# Patient Record
Sex: Female | Born: 1940 | Race: Black or African American | Hispanic: No | Smoking: Former smoker
Health system: Southern US, Community
[De-identification: ages and names within clinical notes are randomized; demographics above are authoritative.]

## PROBLEM LIST (undated history)

## (undated) ENCOUNTER — Emergency Department (HOSPITAL_COMMUNITY): Payer: Medicaid Other

## (undated) DIAGNOSIS — I10 Essential (primary) hypertension: Secondary | ICD-10-CM

## (undated) DIAGNOSIS — H269 Unspecified cataract: Secondary | ICD-10-CM

## (undated) HISTORY — PX: EYE SURGERY: SHX253

## (undated) HISTORY — DX: Unspecified cataract: H26.9

---

## 2020-03-03 ENCOUNTER — Other Ambulatory Visit: Payer: Self-pay

## 2020-03-03 ENCOUNTER — Ambulatory Visit (HOSPITAL_COMMUNITY)
Admission: EM | Admit: 2020-03-03 | Discharge: 2020-03-03 | Disposition: A | Discharge status: Home / Self Care | Payer: Medicaid Other | Attending: Family Medicine | Admitting: Family Medicine

## 2020-03-03 ENCOUNTER — Encounter (HOSPITAL_COMMUNITY): Payer: Self-pay | Admitting: Emergency Medicine

## 2020-03-03 ENCOUNTER — Ambulatory Visit (INDEPENDENT_AMBULATORY_CARE_PROVIDER_SITE_OTHER): Payer: Self-pay

## 2020-03-03 DIAGNOSIS — R0600 Dyspnea, unspecified: Secondary | ICD-10-CM

## 2020-03-03 DIAGNOSIS — R0602 Shortness of breath: Secondary | ICD-10-CM | POA: Diagnosis not present

## 2020-03-03 HISTORY — DX: Essential (primary) hypertension: I10

## 2020-03-03 MED ORDER — PREDNISONE 20 MG PO TABS: 40.0000 mg | ORAL_TABLET | Freq: Every day | ORAL | 0 refills | Status: DC

## 2020-03-03 MED ORDER — AZITHROMYCIN 250 MG PO TABS: 250.0000 mg | ORAL_TABLET | Freq: Every day | ORAL | 0 refills | Status: DC

## 2020-03-03 NOTE — ED Triage Notes (Addendum)
Pain below breast.  Has sob with movement for 3 years .  Sharp pain in rib cage.  Pain is intermittent.  Sob with walking or bending

## 2020-03-03 NOTE — Discharge Instructions (Addendum)
Call for an eye doctor appointment.  Harrold Donath, MD Specialties and/or Subspecialties Ophthalmology  [address] [place], [place] [address] [phone]
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
## 2020-03-09 ENCOUNTER — Encounter: Payer: Self-pay | Admitting: Student

## 2020-04-28 ENCOUNTER — Other Ambulatory Visit: Payer: Self-pay

## 2020-04-28 DIAGNOSIS — E162 Hypoglycemia, unspecified: Secondary | ICD-10-CM

## 2020-04-28 LAB — GLUCOSE, POCT (MANUAL RESULT ENTRY): POC Glucose: 52 mg/dl — AB (ref 70–99)

## 2020-04-28 NOTE — Progress Notes (Signed)
Blood sugar noted to be 52. Client complains of lack of apetitte. She had bread and tea for breakfast. I have offered her a snack and requested her to make sure she eats lunch today. Education provided on causes of low blood sugar and encouraged to eat frequently. I will monitor her blood sugars periodically. [name] Miro Balderson RN BSN PCCN  [phone]-office [phone]-cell

## 2020-04-28 NOTE — Congregational Nurse Program (Signed)
  Dept: [phone]   Congregational Nurse Program Note  Date of Encounter: 04/28/2020  Past Medical History: Past Medical History:  Diagnosis Date  . Hypertension     Encounter Details: This is follow-up pots urgent care Visit.Client referred to [facility] to establish care with PCP. She needs assistance to call and make appointment. Same done. Appointment scheduled for December 16th @10 :15am. RN BSn PCCN [facility] [phone]-cell

## 2020-05-05 ENCOUNTER — Other Ambulatory Visit: Payer: Self-pay

## 2020-05-05 DIAGNOSIS — Z9189 Other specified personal risk factors, not elsewhere classified: Secondary | ICD-10-CM

## 2020-05-05 LAB — GLUCOSE, POCT (MANUAL RESULT ENTRY): POC Glucose: 118 mg/dl — AB (ref 70–99)

## 2020-05-05 NOTE — Congregational Nurse Program (Signed)
  Dept: [phone]   Congregational Nurse Program Note  Date of Encounter: 05/05/2020  Past Medical History: Past Medical History:  Diagnosis Date  . Hypertension     Encounter Details:  CNP Questionnaire - 05/05/20 1326      Questionnaire   Do you give verbal consent to treat you today? Yes    Visit Setting Church or Organization    Location Patient Served At [facility]    Patient Status Refugee    Medical Provider Yes    Insurance Medicaid    Intervention Advocate;Assess (including screenings);Counsel;Educate;Refer;Support    Housing/Utilities Worried about losing Nurse, adult Within past 12 months, worried food would run out with no money to buy more    Medication Have medication insecurities;Provided medication assistance (Pharmacies, drug rep, etc.)    Referrals Medication Assistance;PCP - [facility]    ED Visit Averted Yes         Client came in c/o epigastric pain which she describes as acid reflux pain. She has appointment with PCP to establish care on December 16 th at 10:15a. I have called and confirmed date and time of this appointment.Mean while she would like to try OTC medication. I have also advised her to eat small frequent meals and avoid foods that increase her discomfort.  [name] Hilda Wexler RN BSN PCCn  [facility]  [phone]-cell [phone]-office

## 2020-05-13 ENCOUNTER — Other Ambulatory Visit: Payer: Self-pay

## 2020-05-13 ENCOUNTER — Ambulatory Visit (INDEPENDENT_AMBULATORY_CARE_PROVIDER_SITE_OTHER): Payer: Medicaid Other | Admitting: Internal Medicine

## 2020-05-13 ENCOUNTER — Encounter: Payer: Self-pay | Admitting: Internal Medicine

## 2020-05-13 VITALS — BP 149/90 | HR 89 | Temp 98.2°F | Ht 62.0 in | Wt 112.9 lb

## 2020-05-13 DIAGNOSIS — R0600 Dyspnea, unspecified: Secondary | ICD-10-CM | POA: Diagnosis not present

## 2020-05-13 DIAGNOSIS — R739 Hyperglycemia, unspecified: Secondary | ICD-10-CM

## 2020-05-13 DIAGNOSIS — R1011 Right upper quadrant pain: Secondary | ICD-10-CM

## 2020-05-13 DIAGNOSIS — R030 Elevated blood-pressure reading, without diagnosis of hypertension: Secondary | ICD-10-CM

## 2020-05-13 DIAGNOSIS — R0609 Other forms of dyspnea: Secondary | ICD-10-CM

## 2020-05-13 DIAGNOSIS — Z0000 Encounter for general adult medical examination without abnormal findings: Secondary | ICD-10-CM | POA: Insufficient documentation

## 2020-05-13 HISTORY — DX: Other forms of dyspnea: R06.09

## 2020-05-13 LAB — GLUCOSE, CAPILLARY: Glucose-Capillary: 102 mg/dL — ABNORMAL HIGH (ref 70–99)

## 2020-05-13 LAB — POCT GLYCOSYLATED HEMOGLOBIN (HGB A1C): Hemoglobin A1C: 5.6 % (ref 4.0–5.6)

## 2020-05-13 NOTE — Assessment & Plan Note (Signed)
2-3 year history of dyspnea on exertion.  I strongly suspect she has COPD based on her CXR from October and severely diminished lung sounds on exam today. I question the accuracy of her report of her smoking history 4 cigarettes a day for 40 years is an 8 pack year history and her CXR shows fairly significant hyperinflation A cardiac etiology can not be ruled out at this time. She does not show the stigmata of volume overload on exam. Given her smoking history, age and race, would consider atherosclerotic disease possible, although she denies chest pain. She could also have pulmonary hypertension related to suspect COPD. Plan -echocardiogram to r/o cardiac dysfunction and pulmonary hypertension -PFTs to evaluate for obstructive lung disease -TSH -Lipid panel -f/u in January once these are completed to discuss results and further management

## 2020-05-13 NOTE — Assessment & Plan Note (Addendum)
39mo history of RUQ and epigastric pain that is constant but worse with food. Pain radiates to the right shoulder blade ddx includes cholelithiasis, hepatitis, PUD, pancreatitis, liver, pancreatic or biliary malignancy. Chart review does indicate a random glucose check 52 which would suggest a liver pathology. A1C today is 5.6, glucose 102, which is somewhat reassuring that hypoglycemia is not a persistent issue. Plan -CMP to assess liver enzymes and bili -lipase to r/o acute pancreatitis  -viral hepatitis panel -H. Pylori stool antigen -RUQ [place]  -f/u in January pending completion of the imaging

## 2020-05-13 NOTE — Assessment & Plan Note (Signed)
She is due for osteoporosis screening, pna vaccination, influenza vaccination, COVID vaccination. Due to time limitations at today's visit, will need to follow these up at a future visit.

## 2020-05-13 NOTE — Progress Notes (Signed)
New Patient Office Visit  Subjective:  Patient ID: Alyssa Hammond, female    DOB: [birth year]  Age: 79 y.o. MRN: [id number]  CC: Right upper quadrant pain, shortness of breath  HPI Alyssa Hammond is a 79 year old female from [place] in the [place] region who is presenting today for establishment of care as well as evaluation for some acute complaints. In person interpreter was used throughout the entirety of the encounter.  She has no known medical history She has no known surgical history.  Right upper quadrant/epigastric pain She has a 11-month history of pain in her right upper quadrant and epigastric region that radiates laterally around to her shoulder blade.  The pain is constant but she does feel like it is worse with food.  She feels like she gets full very quickly.  She denies nausea or vomiting.  No prior similar symptoms in the past.  She denies a history of liver infections.  She denies any decrease in her oral intake and denies weight loss.  She denies night sweats.  She denies any skin color changes.  Shortness of breath This is been going on for around 2 to 3 years and is primarily related to exertion, mainly when walking uphill.  She notes that she has a chronic cough that is nonproductive.  She denies chest pain or palpitations associated with this.  The shortness of breath resolves with rest.  She denies prior cardiac conditions.  She denies lower extremity swelling or orthopnea. She admits to smoking about 4 cigarettes a day for 30 to 40 years but has not smoked now for over 15 years.   Past Medical History:  Diagnosis Date  . Cataract     Past Surgical History:  Procedure Laterality Date  . EYE SURGERY      Family History  Family history unknown: Yes    Social History   Socioeconomic History  . Marital status: Unknown    Spouse name: Not on file  . Number of children: Not on file  . Years of education: Not on file  . Highest education level: Not on  file  Occupational History  . Not on file  Tobacco Use  . Smoking status: Former Smoker    Packs/day: 0.25    Years: 40.00    Pack years: 10.00    Types: Cigarettes  . Smokeless tobacco: Never Used  . Tobacco comment: stopped years ago  Substance and Sexual Activity  . Alcohol use: Not Currently  . Drug use: Never  . Sexual activity: Not on file  Other Topics Concern  . Not on file  Social History Narrative  . Not on file   Social Determinants of Health   Financial Resource Strain: Not on file  Food Insecurity: Not on file  Transportation Needs: Not on file  Physical Activity: Not on file  Stress: Not on file  Social Connections: Not on file  Intimate Partner Violence: Not on file    ROS Review of Systems  Constitutional: Negative for activity change, appetite change, chills, fatigue, fever and unexpected weight change.  HENT: Negative for sore throat and trouble swallowing.   Eyes: Negative for visual disturbance.  Respiratory: Positive for cough and shortness of breath. Negative for chest tightness and wheezing.   Cardiovascular: Negative for chest pain, palpitations and leg swelling.  Gastrointestinal: Positive for abdominal distention and abdominal pain. Negative for blood in stool, constipation, diarrhea, nausea and vomiting.  Endocrine: Negative for cold intolerance.  Genitourinary: Negative for  difficulty urinating and hematuria.  Musculoskeletal: Negative for arthralgias.  Skin: Negative for color change.  Neurological: Negative for dizziness, light-headedness and headaches.  Psychiatric/Behavioral: The patient is not nervous/anxious.     Objective:   Today's Vitals: BP (!) 149/90 (BP Location: Left Arm, Patient Position: Sitting, Cuff Size: Normal)   Pulse 89   Temp 98.2 F (36.8 C) (Oral)   Ht 5\' 2"  (1.575 m)   Wt 112 lb 14.4 oz (51.2 kg)   SpO2 99%   BMI 20.65 kg/m   Physical Exam Constitutional:      Appearance: Normal appearance.  HENT:      Mouth/Throat:     Mouth: Mucous membranes are moist.     Pharynx: Oropharynx is clear.  Eyes:     General: No scleral icterus.    Conjunctiva/sclera: Conjunctivae normal.  Cardiovascular:     Rate and Rhythm: Normal rate and regular rhythm.  Pulmonary:     Effort: Pulmonary effort is normal.     Breath sounds: Decreased air movement present.  Abdominal:     Palpations: Abdomen is soft. There is hepatomegaly.     Tenderness: There is abdominal tenderness in the right upper quadrant and epigastric area. There is no right CVA tenderness, left CVA tenderness or rebound.  Musculoskeletal:     Cervical back: Normal range of motion.     Right lower leg: No edema.     Left lower leg: No edema.  Skin:    General: Skin is warm and dry.  Neurological:     General: No focal deficit present.     Mental Status: She is oriented to person, place, and time.  Psychiatric:        Mood and Affect: Mood normal.        Thought Content: Thought content normal.     Assessment & Plan:   Problem List Items Addressed This Visit      Other   RUQ pain - Primary    21mo history of RUQ and epigastric pain that is constant but worse with food. Pain radiates to the right shoulder blade ddx includes cholelithiasis, hepatitis, PUD, pancreatitis, liver, pancreatic or biliary malignancy. Chart review does indicate a random glucose check 52 which would suggest a liver pathology. A1C today is 5.6, glucose 102, which is somewhat reassuring that hypoglycemia is not a persistent issue. Plan -CMP to assess liver enzymes and bili -lipase to r/o acute pancreatitis  -viral hepatitis panel -H. Pylori stool antigen -RUQ 0mo  -f/u in January pending completion of the imaging      Relevant Orders   CBC with Diff   CMP14 + Anion Gap   HIV antibody (with reflex)   February Abdomen Limited RUQ (LIVER/GB)   Acute Viral Hepatitis (HAV, HBV, HCV)   Lipase   H. pylori antigen, stool   Dyspnea on exertion    2-3 year history  of dyspnea on exertion.  I strongly suspect she has COPD based on her CXR from October and severely diminished lung sounds on exam today. I question the accuracy of her report of her smoking history 4 cigarettes a day for 40 years is an 8 pack year history and her CXR shows fairly significant hyperinflation A cardiac etiology can not be ruled out at this time. She does not show the stigmata of volume overload on exam. Given her smoking history, age and race, would consider atherosclerotic disease possible, although she denies chest pain. She could also have pulmonary hypertension related to  suspect COPD. Plan -echocardiogram to r/o cardiac dysfunction and pulmonary hypertension -PFTs to evaluate for obstructive lung disease -TSH -Lipid panel -f/u in January once these are completed to discuss results and further management      Hyperglycemia    Random glucose check by congregational nursing showed an elevated glucose. A1C today is 5.6.       Relevant Orders   POC Hbg A1C (Completed)   Healthcare maintenance    She is due for osteoporosis screening, pna vaccination, influenza vaccination, COVID vaccination. Due to time limitations at today's visit, will need to follow these up at a future visit.       Other Visit Diagnoses    Dyspnea, unspecified type       Relevant Orders   TSH   ECHOCARDIOGRAM COMPLETE   Pulmonary function test   Elevated blood pressure reading       Relevant Orders   Lipid panel      Outpatient Encounter Medications as of 05/13/2020  Medication Sig  . [DISCONTINUED] azithromycin (ZITHROMAX) 250 MG tablet Take 1 tablet (250 mg total) by mouth daily. Take first 2 tablets together, then 1 every day until finished.  . [DISCONTINUED] NON FORMULARY Blood pressure  . [DISCONTINUED] predniSONE (DELTASONE) 20 MG tablet Take 2 tablets (40 mg total) by mouth daily.   No facility-administered encounter medications on file as of 05/13/2020.    Follow-up: Return in  about 2 weeks (around 05/27/2020).    Patient Discussed with Dr. Sandre Kitty.  Elige Radon, MD Internal Medicine Resident PGY-2 [facility] Pager: [phone] 05/13/2020 6:45 PM

## 2020-05-13 NOTE — Assessment & Plan Note (Signed)
Random glucose check by congregational nursing showed an elevated glucose. A1C today is 5.6.

## 2020-05-14 LAB — CMP14 + ANION GAP
ALT: 15 IU/L (ref 0–32)
AST: 21 IU/L (ref 0–40)
Albumin/Globulin Ratio: 1.8 (ref 1.2–2.2)
Albumin: 4.5 g/dL (ref 3.7–4.7)
Alkaline Phosphatase: 68 IU/L (ref 44–121)
Anion Gap: 14 mmol/L (ref 10.0–18.0)
BUN/Creatinine Ratio: 18 (ref 12–28)
BUN: 13 mg/dL (ref 8–27)
Bilirubin Total: 0.3 mg/dL (ref 0.0–1.2)
CO2: 26 mmol/L (ref 20–29)
Calcium: 9.6 mg/dL (ref 8.7–10.3)
Chloride: 102 mmol/L (ref 96–106)
Creatinine, Ser: 0.71 mg/dL (ref 0.57–1.00)
GFR calc Af Amer: 95 mL/min/{1.73_m2} (ref >59)
GFR calc non Af Amer: 82 mL/min/{1.73_m2} (ref >59)
Globulin, Total: 2.5 g/dL (ref 1.5–4.5)
Glucose: 109 mg/dL — ABNORMAL HIGH (ref 65–99)
Potassium: 4.5 mmol/L (ref 3.5–5.2)
Sodium: 142 mmol/L (ref 134–144)
Total Protein: 7 g/dL (ref 6.0–8.5)

## 2020-05-14 LAB — CBC WITH DIFFERENTIAL/PLATELET
Basophils Absolute: 0 10*3/uL (ref 0.0–0.2)
Basos: 1 %
EOS (ABSOLUTE): 0.1 10*3/uL (ref 0.0–0.4)
Eos: 1 %
Hematocrit: 36.5 % (ref 34.0–46.6)
Hemoglobin: 12.3 g/dL (ref 11.1–15.9)
Immature Grans (Abs): 0 10*3/uL (ref 0.0–0.1)
Immature Granulocytes: 0 %
Lymphocytes Absolute: 1.6 10*3/uL (ref 0.7–3.1)
Lymphs: 36 %
MCH: 30.8 pg (ref 26.6–33.0)
MCHC: 33.7 g/dL (ref 31.5–35.7)
MCV: 92 fL (ref 79–97)
Monocytes Absolute: 0.3 10*3/uL (ref 0.1–0.9)
Monocytes: 7 %
Neutrophils Absolute: 2.5 10*3/uL (ref 1.4–7.0)
Neutrophils: 55 %
Platelets: 291 10*3/uL (ref 150–450)
RBC: 3.99 x10E6/uL (ref 3.77–5.28)
RDW: 12.7 % (ref 11.7–15.4)
WBC: 4.5 10*3/uL (ref 3.4–10.8)

## 2020-05-14 LAB — TSH: TSH: 1.85 u[IU]/mL (ref 0.450–4.500)

## 2020-05-14 LAB — LIPID PANEL
Chol/HDL Ratio: 3.8 ratio (ref 0.0–4.4)
Cholesterol, Total: 223 mg/dL — ABNORMAL HIGH (ref 100–199)
HDL: 59 mg/dL (ref >39)
LDL Chol Calc (NIH): 140 mg/dL — ABNORMAL HIGH (ref 0–99)
Triglycerides: 133 mg/dL (ref 0–149)
VLDL Cholesterol Cal: 24 mg/dL (ref 5–40)

## 2020-05-14 LAB — ACUTE VIRAL HEPATITIS (HAV, HBV, HCV)
HCV Ab: <0.1 s/co ratio (ref 0.0–0.9)
Hep A IgM: NEGATIVE
Hep B C IgM: NEGATIVE
Hepatitis B Surface Ag: NEGATIVE

## 2020-05-14 LAB — LIPASE: Lipase: 35 U/L (ref 14–85)

## 2020-05-14 LAB — HIV ANTIBODY (ROUTINE TESTING W REFLEX): HIV Screen 4th Generation wRfx: NONREACTIVE

## 2020-05-14 LAB — HCV INTERPRETATION

## 2020-05-14 NOTE — Progress Notes (Signed)
Internal Medicine Clinic Attending  Case discussed with Dr. Christian at the time of the visit.  We reviewed the resident's history and exam and pertinent patient test results.  I agree with the assessment, diagnosis, and plan of care documented in the resident's note.  Donyale Berthold, M.D., Ph.D.  

## 2020-05-18 ENCOUNTER — Other Ambulatory Visit: Payer: Self-pay

## 2020-05-18 ENCOUNTER — Other Ambulatory Visit: Payer: Medicaid Other

## 2020-05-18 DIAGNOSIS — R1011 Right upper quadrant pain: Secondary | ICD-10-CM

## 2020-05-20 LAB — H. PYLORI ANTIGEN, STOOL: H pylori Ag, Stl: NEGATIVE

## 2020-05-24 ENCOUNTER — Other Ambulatory Visit (HOSPITAL_COMMUNITY): Payer: Medicaid Other

## 2020-05-26 ENCOUNTER — Inpatient Hospital Stay (HOSPITAL_COMMUNITY): Admission: RE | Admit: 2020-05-26 | Payer: Medicaid Other | Source: Ambulatory Visit

## 2020-05-31 ENCOUNTER — Telehealth: Payer: Self-pay

## 2020-05-31 NOTE — Telephone Encounter (Signed)
Patient needs assistance with transportation to a Covid testing site. Contacted [facility] transportation, and patient will be picked up tomorrow for appointment time 0905hrs.Patient called and informed in swahili.She verbalized understanding. [name] Balinda Heacock RN BSn PCCn [facility] [phone]-cell [phone]-office

## 2020-06-01 ENCOUNTER — Other Ambulatory Visit (HOSPITAL_COMMUNITY)
Admission: RE | Admit: 2020-06-01 | Discharge: 2020-06-01 | Disposition: A | Discharge status: Home / Self Care | Payer: Medicaid Other | Source: Ambulatory Visit | Attending: Internal Medicine | Admitting: Internal Medicine

## 2020-06-01 DIAGNOSIS — Z01812 Encounter for preprocedural laboratory examination: Secondary | ICD-10-CM | POA: Insufficient documentation

## 2020-06-01 DIAGNOSIS — U071 COVID-19: Secondary | ICD-10-CM | POA: Diagnosis not present

## 2020-06-02 ENCOUNTER — Telehealth: Payer: Self-pay

## 2020-06-02 LAB — SARS CORONAVIRUS 2 (TAT 6-24 HRS): SARS Coronavirus 2: POSITIVE — AB

## 2020-06-02 NOTE — Progress Notes (Signed)
Patient tested positive for Covid-19. Attempted to gave results to Dr. Sandre Kitty' office/reffering doctor, was told to call [facility] Respiratory Department. Left VM detailing patient results.

## 2020-06-02 NOTE — Telephone Encounter (Signed)
Noted positive results for Covid. Patient and family informed quarantine guidelines and verbalized understanding.  Arman Bogus RN BSn PCCN  [facility] [phone]-cell [phone]-office

## 2020-06-02 NOTE — Progress Notes (Signed)
Spoke with the son of Alyssa Hammond.( [place]  [place])  Called him at [phone]  today at 1550 .  He as been informed that the PFT appt has to be canceled and rescheduled.  Covid swab results are positive for his mom.

## 2020-06-03 ENCOUNTER — Inpatient Hospital Stay (HOSPITAL_COMMUNITY)
Admission: RE | Admit: 2020-06-03 | Discharge: 2020-06-03 | Disposition: A | Discharge status: Home / Self Care | Payer: Medicaid Other | Source: Ambulatory Visit | Attending: Internal Medicine | Admitting: Internal Medicine

## 2020-06-21 ENCOUNTER — Telehealth: Payer: Self-pay

## 2020-06-21 NOTE — Telephone Encounter (Signed)
PFT was previously cancelled due to positive covid diagnosis.I have contacted [facility] and left a message for a call back to reschedule this appointment. Waiting for a call back.  Arman Bogus RN BSn PCCN  [facility] [phone]-cell [phone]-office

## 2020-06-22 ENCOUNTER — Telehealth: Payer: Self-pay

## 2020-06-22 ENCOUNTER — Telehealth: Payer: Self-pay | Admitting: *Deleted

## 2020-06-22 NOTE — Telephone Encounter (Signed)
PFT rescheduled February 16th @1300hrs   RN BSn [facility]  [facility] [phone]-cell [phone]-office

## 2020-06-22 NOTE — Congregational Nurse Program (Signed)
  Dept: [phone]   Congregational Nurse Program Note  Date of Encounter: 06/22/2020  Past Medical History: Past Medical History:  Diagnosis Date  . Cataract    Shortness of Breath  - Reports SOB for several months;physician has ordered PFTs to assess best course of action   RUQ Pain - Previously assessed by [facility]. Physicians pending imaging and labs to determine treatment  Encounter Details:  CNP Questionnaire - 06/22/20 1120      Questionnaire   Do you give verbal consent to treat you today? Yes    Visit Setting Church or Organization    Location Patient Served At [facility]    Patient Status Refugee    Medical Provider Yes    Insurance Medicaid    Intervention Advocate;Assess (including screenings);Counsel;Educate;Refer;Support    ED Visit Averted Yes          Patient presents with SOB that worsens with exertion. Was scheduled to receive PFTs but had to cancel due to positive COVID test. I have contacted PCP office and  Scheduled a follow-up appointment with physician tomorrow at 1:45 PM. Will follow-up to reschedule PFT as well.   Arman Bogus RN BSn PCCN  [facility] [phone]-cell [phone]-office

## 2020-06-22 NOTE — Telephone Encounter (Signed)
Call from [facility],congregational nurse - stated pt c/o sob w/ex[place]. Tested covid+ 3 weeks ago. PFT's were not done b/c of positive test. [name] stated she had called pulmonary yesterday, has not received a call back yet.  Denies any other symptoms. No available appts today. Call transferred to front office - appt scheduled for tomorrow @ 1345 PM with Dr Elaina Pattee. Informed to go to ED/UC for worsen symptoms.

## 2020-06-23 ENCOUNTER — Other Ambulatory Visit: Payer: Self-pay

## 2020-06-23 ENCOUNTER — Encounter: Payer: Self-pay | Admitting: Student

## 2020-06-23 ENCOUNTER — Ambulatory Visit (INDEPENDENT_AMBULATORY_CARE_PROVIDER_SITE_OTHER): Payer: Medicaid Other | Admitting: Student

## 2020-06-23 ENCOUNTER — Telehealth: Payer: Self-pay

## 2020-06-23 VITALS — BP 138/87 | HR 91 | Temp 98.5°F | Wt 119.3 lb

## 2020-06-23 DIAGNOSIS — Z0000 Encounter for general adult medical examination without abnormal findings: Secondary | ICD-10-CM | POA: Diagnosis not present

## 2020-06-23 DIAGNOSIS — R0600 Dyspnea, unspecified: Secondary | ICD-10-CM | POA: Diagnosis not present

## 2020-06-23 DIAGNOSIS — Z23 Encounter for immunization: Secondary | ICD-10-CM | POA: Diagnosis not present

## 2020-06-23 DIAGNOSIS — R0609 Other forms of dyspnea: Secondary | ICD-10-CM

## 2020-06-23 NOTE — Telephone Encounter (Signed)
[facility] transportation services contacted to provide a ride to PCP office.Intern from my office will accompany the  patient to direct the patient to the right location.  Arman Bogus RN BSn PCCN  [facility] [phone]-cell [phone]-office

## 2020-06-23 NOTE — Patient Instructions (Signed)
It was a pleasure seeing you in clinic. Today we discussed:   Please have your PFT done you have an appointment on February 16 at 1 pm. Please follow in 1 month once you have had the tests done.  If you have any questions or concerns, please call our clinic at [phone] between 9am-5pm and after hours call [phone] and ask for the internal medicine resident on call. If you feel you are having a medical emergency please call 911.   Thank you, we look forward to helping you remain healthy!

## 2020-06-24 NOTE — Assessment & Plan Note (Addendum)
Patient presents for follow of dyspnea on exertion. Was unable to have PFT this month due to testing positive for COVID 19 on 06/01/2020. States had no symptoms associated with COVID and denies fever, chills, chest pain, cough, n/v, worsening dyspnea. She reports no change to her dyspnea and functionally able to complete ADLS and IADLS. Agree with Dr. Ephriam Knuckles that her DOE is concerning for COPD, her PFTs have been rescheduled for 07/14/2020 and communicated this with patient. Will hold off on empiric treatment as this is not significantly function limiting for her.  Plan  Follow up in 1 month once she has had PFTs

## 2020-06-24 NOTE — Assessment & Plan Note (Signed)
Pneumonia vaccine adminstered today  Due for osteoporosis screening but has several pending imaging and test appointments will discuss this further once acute issues are adressed

## 2020-06-24 NOTE — Progress Notes (Signed)
Internal Medicine Clinic Attending ? ?Case discussed with Dr. Liang  At the time of the visit.  We reviewed the resident?s history and exam and pertinent patient test results.  I agree with the assessment, diagnosis, and plan of care documented in the resident?s note. ? ?

## 2020-06-24 NOTE — Progress Notes (Signed)
   CC: dyspnea  HPI:  [name] is a 80 y.o. female presents for follow up of chronic dyspnea she has had for the past 3 years. Please refer to problem based charting for further details and assessment and plan of current problem and chronic medical conditions.   Past Medical History:  Diagnosis Date  . Cataract    Review of Systems:  Negative as per HPI  Physical Exam:  Vitals:   06/23/20 1357  BP: 138/87  Pulse: 91  Temp: 98.5 F (36.9 C)  TempSrc: Oral  SpO2: 99%  Weight: 119 lb 4.8 oz (54.1 kg)   Physical Exam Constitutional:      Appearance: She is well-developed.  HENT:     Head: Normocephalic and atraumatic.     Mouth/Throat:     Mouth: Mucous membranes are moist.     Pharynx: Oropharynx is clear.  Eyes:     Extraocular Movements: Extraocular movements intact.     Pupils: Pupils are equal, round, and reactive to light.  Cardiovascular:     Rate and Rhythm: Normal rate and regular rhythm.  Pulmonary:     Effort: Pulmonary effort is normal. No respiratory distress.     Breath sounds: Decreased breath sounds present. No wheezing or rhonchi.  Abdominal:     Palpations: Abdomen is soft.     Comments: RUQ tenderness  Musculoskeletal:        General: Normal range of motion.     Right lower leg: No edema.  Skin:    General: Skin is warm and dry.     Capillary Refill: Capillary refill takes less than 2 seconds.  Neurological:     General: No focal deficit present.     Mental Status: She is alert. She is disoriented.  Psychiatric:        Mood and Affect: Mood normal.        Behavior: Behavior normal.      Assessment & Plan:   See Encounters Tab for problem based charting.  Patient discussed with Dr. Oswaldo Done

## 2020-06-29 NOTE — Congregational Nurse Program (Signed)
  Dept: [phone]   Congregational Nurse Program Note  Date of Encounter: 06/29/2020  Past Medical History: Past Medical History:  Diagnosis Date  . Cataract     Encounter Details: Patient came in for blood pressure check,she denies shortness of breath.She is awaiting pulmonary functions test appointment is scheduled for 07/14/2020.Transportation assistance will be provided by congregational nurse program.  Arman Bogus RN BSn [facility]  [facility] [phone]-cell [phone]-office

## 2020-07-06 DIAGNOSIS — R682 Dry mouth, unspecified: Secondary | ICD-10-CM

## 2020-07-06 LAB — GLUCOSE, POCT (MANUAL RESULT ENTRY): POC Glucose: 79 mg/dl (ref 70–99)

## 2020-07-06 NOTE — Congregational Nurse Program (Signed)
Patient came in today complaining of dry mouth as well as questions about an outstanding medical bill. Her blood pressure was 137/79. Blood glucose was 79. She had tea and bread for breakfast. Patient counseled on eating more for breakfast. She reports decreased appetite.  Billing was contacted because insurance was not on file. Plan to provide insurance information and patient will be sent new bill after insurance is put in.    Patient has an appointment for PFT on 07/14/2020 at 1pm. Plan to schedule patient for PCP follow up after.   Arman Bogus RN BSn [facility]  [facility] [phone]-cell [phone]-office

## 2020-07-13 NOTE — Congregational Nurse Program (Signed)
Patient seen today for assistance with mail and appointments. She received a postcard from [facility] stating that her glasses are ready for pick up. Will schedule transportation to pick up glasses today.  Had lots of diarrhea yesterday and nothing today. Also was vomiting yesterday. Today has nausea, but no nausea. No bloody diarrhea or hematemesis reported yesterday. Patient counseled on staying hydrated. Advised against anti-diarrheals today given that patient is no longer experiencing diarrhea.   BP 122/78 (manual)   Pulmonary Function Testing scheduled for tomorrow (07/14/2020 at 1pm). Will schedule patient for follow up after PFTs.  Arman Bogus RN BSn [facility]  [facility] [phone]-cell [phone]-office

## 2020-07-14 ENCOUNTER — Other Ambulatory Visit: Payer: Self-pay

## 2020-07-14 ENCOUNTER — Ambulatory Visit (HOSPITAL_COMMUNITY)
Admission: RE | Admit: 2020-07-14 | Discharge: 2020-07-14 | Disposition: A | Discharge status: Home / Self Care | Payer: Medicaid Other | Source: Ambulatory Visit | Attending: Internal Medicine | Admitting: Internal Medicine

## 2020-07-14 DIAGNOSIS — R0600 Dyspnea, unspecified: Secondary | ICD-10-CM | POA: Insufficient documentation

## 2020-07-14 LAB — PULMONARY FUNCTION TEST
DL/VA % pred: 71 %
DL/VA: 2.96 ml/min/mmHg/L
DLCO unc % pred: 54 %
DLCO unc: 9.43 ml/min/mmHg
FEF 25-75 Post: 0.82 L/sec
FEF 25-75 Pre: 0.6 L/sec
FEF2575-%Change-Post: 36 %
FEF2575-%Pred-Post: 67 %
FEF2575-%Pred-Pre: 49 %
FEV1-%Change-Post: 11 %
FEV1-%Pred-Post: 100 %
FEV1-%Pred-Pre: 90 %
FEV1-Post: 1.41 L
FEV1-Pre: 1.26 L
FEV1FVC-%Change-Post: 8 %
FEV1FVC-%Pred-Pre: 79 %
FEV6-%Change-Post: 2 %
FEV6-%Pred-Post: 120 %
FEV6-%Pred-Pre: 117 %
FEV6-Post: 2.09 L
FEV6-Pre: 2.04 L
FEV6FVC-%Change-Post: 0 %
FEV6FVC-%Pred-Post: 102 %
FEV6FVC-%Pred-Pre: 102 %
FVC-%Change-Post: 2 %
FVC-%Pred-Post: 117 %
FVC-%Pred-Pre: 114 %
FVC-Post: 2.15 L
FVC-Pre: 2.09 L
Post FEV1/FVC ratio: 66 %
Post FEV6/FVC ratio: 97 %
Pre FEV1/FVC ratio: 60 %
Pre FEV6/FVC Ratio: 97 %
RV % pred: 238 %
RV: 5.3 L
TLC % pred: 156 %
TLC: 7.32 L

## 2020-07-14 MED ORDER — ALBUTEROL SULFATE (2.5 MG/3ML) 0.083% IN NEBU
2.5000 mg | INHALATION_SOLUTION | Freq: Once | RESPIRATORY_TRACT | Status: AC
  Administered 2020-07-14: 2.5 mg via RESPIRATORY_TRACT

## 2020-07-14 NOTE — Congregational Nurse Program (Signed)
  Dept: [phone]   Congregational Nurse Program Note  Date of Encounter: 07/14/2020  Past Medical History: Past Medical History:  Diagnosis Date  . Cataract     Encounter Details:  CNP Questionnaire - 07/14/20 1419      Questionnaire   Do you give verbal consent to treat you today? Yes          Accompanied patient for PFTs at [facility] completed. Ride provided back home by [employer]  Arman Bogus RN BSn [facility]  [facility] [phone]-cell [phone]-office

## 2020-07-20 NOTE — Congregational Nurse Program (Signed)
Patient seen today to discuss results of PFTs. Appointment was scheduled with PCP to discuss results of PFT that was performed on 07/14/2020.   BP 127/75 HR 74  Appointment scheduled for Friday, February 25th at 9:15am with [facility] Internal Medicine  Arman Bogus RN BSn [facility]  [facility] [phone]-cell [phone]-office

## 2020-07-23 ENCOUNTER — Ambulatory Visit: Payer: Medicaid Other | Admitting: Internal Medicine

## 2020-07-23 ENCOUNTER — Encounter: Payer: Self-pay | Admitting: Internal Medicine

## 2020-07-23 VITALS — BP 139/80 | HR 87 | Temp 98.0°F | Ht 62.0 in | Wt 115.7 lb

## 2020-07-23 DIAGNOSIS — R0600 Dyspnea, unspecified: Secondary | ICD-10-CM | POA: Diagnosis not present

## 2020-07-23 DIAGNOSIS — R0781 Pleurodynia: Secondary | ICD-10-CM | POA: Diagnosis not present

## 2020-07-23 DIAGNOSIS — R0609 Other forms of dyspnea: Secondary | ICD-10-CM

## 2020-07-23 DIAGNOSIS — J449 Chronic obstructive pulmonary disease, unspecified: Secondary | ICD-10-CM | POA: Diagnosis not present

## 2020-07-23 MED ORDER — DICLOFENAC SODIUM 1 % EX GEL: 4.0000 g | Freq: Four times a day (QID) | CUTANEOUS | 0 refills | Status: AC

## 2020-07-23 MED ORDER — ALBUTEROL SULFATE HFA 108 (90 BASE) MCG/ACT IN AERS: 2.0000 | INHALATION_SPRAY | Freq: Four times a day (QID) | RESPIRATORY_TRACT | 2 refills | Status: DC | PRN

## 2020-07-23 MED ORDER — SPIRIVA RESPIMAT 2.5 MCG/ACT IN AERS: 2.0000 | INHALATION_SPRAY | Freq: Every day | RESPIRATORY_TRACT | 2 refills | Status: DC

## 2020-07-23 NOTE — Patient Instructions (Addendum)
Thank you for allowing [place] to provide your care today. Your breathing test (PFT) showed your have COPD.   I prescribe 2 inhalers for you. Please use them as instrcuted:  1-Shortness of breath due to COPD: - albuterol (VENTOLIN HFA) 108 (90 Base) MCG/ACT inhaler; Inhale 2 puffs into the lungs every 6 (six) hours as needed for wheezing or shortness of breath.  Dispense: 8 g; Refill: 2 - Tiotropium Bromide Monohydrate (SPIRIVA RESPIMAT) 2.5 MCG/ACT AERS; Inhale 2 puffs into the lungs daily.  Dispense: 1 each; Refill: 2  For your rib pain:  2. Rib pain on right side: Seems to be muscle pain  I prescribe diclofenac Sodium (VOLTAREN) GEL;  Apply 4 g topically 4 (four) times daily.  Dispense: 4 g; Refill: 0   Please come back to clinic in 2-3 months or earlier if your symptoms get worse or not improved. As always, if having severe symptoms, please seek medical attention at emergency room. Should you have any questions or concerns please call the internal medicine clinic at [phone].    Thank you!

## 2020-07-23 NOTE — Progress Notes (Signed)
Established Patient Office Visit  Subjective:  Patient ID: Alyssa Hammond, female    DOB: May 20, 1941  Age: 80 y.o. MRN: [id number]  CC: F/u of chronic dyspnea and PFt result  HPI Alyssa Hammond presents for f/u of chronic dyspnea and discussing PFT results. Please refer to problem based charting for further details and assessment and plan of current problem and chronic medical conditions.  PMHx: Chronic dyspnea she has had for the past 3 years., RUQ pain, hyperglycemia, COVID 19 infection on 06/01/2020  Medications: None No flowsheet data found.   Past Medical History:  Diagnosis Date  . Cataract   . Chronic obstructive pulmonary disease (HCC) 07/26/2020    Past Surgical History:  Procedure Laterality Date  . EYE SURGERY      Family History  Family history unknown: Yes    Social History   Socioeconomic History  . Marital status: Unknown    Spouse name: Not on file  . Number of children: Not on file  . Years of education: Not on file  . Highest education level: Not on file  Occupational History  . Not on file  Tobacco Use  . Smoking status: Former Smoker    Packs/day: 0.25    Years: 40.00    Pack years: 10.00    Types: Cigarettes  . Smokeless tobacco: Never Used  . Tobacco comment: stopped years ago  Substance and Sexual Activity  . Alcohol use: Not Currently  . Drug use: Never  . Sexual activity: Not on file  Other Topics Concern  . Not on file  Social History Narrative  . Not on file   Social Determinants of Health   Financial Resource Strain: Not on file  Food Insecurity: Not on file  Transportation Needs: Not on file  Physical Activity: Not on file  Stress: Not on file  Social Connections: Not on file  Intimate Partner Violence: Not on file    No outpatient medications prior to visit.   No facility-administered medications prior to visit.    Allergies  Allergen Reactions  . Ibuprofen Other (See Comments)    ROS Review of  Systems    Objective:    Physical Exam Constitutional:      Appearance: Normal appearance. She is not ill-appearing.  Pulmonary:     Effort: No respiratory distress.     Breath sounds: No wheezing.     Comments: Breath sound are mildly decreased Abdominal:     Palpations: Abdomen is soft.     Tenderness: There is no abdominal tenderness.  Musculoskeletal:        General: Tenderness present.     Right lower leg: No edema.     Left lower leg: No edema.     Comments: Mild tenderness of right side of the chest on anterior ribs. No bruise  Neurological:     Mental Status: She is alert.  Psychiatric:        Mood and Affect: Mood normal.        Behavior: Behavior normal.    BP 139/80 (BP Location: Right Arm, Patient Position: Sitting, Cuff Size: Small)   Pulse 87   Temp 98 F (36.7 C) (Oral)   Ht 5\' 2"  (1.575 m)   Wt 115 lb 11.2 oz (52.5 kg)   SpO2 97%   BMI 21.16 kg/m  Wt Readings from Last 3 Encounters:  07/23/20 115 lb 11.2 oz (52.5 kg)  06/23/20 119 lb 4.8 oz (54.1 kg)  05/13/20 112 lb 14.4  oz (51.2 kg)     Health Maintenance Due  Topic Date Due  . COVID-19 Vaccine (1) Never done  . TETANUS/TDAP  Never done  . DEXA SCAN  Never done    There are no preventive care reminders to display for this patient.  Lab Results  Component Value Date   TSH 1.850 05/13/2020   Lab Results  Component Value Date   WBC 4.5 05/13/2020   HGB 12.3 05/13/2020   HCT 36.5 05/13/2020   MCV 92 05/13/2020   PLT 291 05/13/2020   Lab Results  Component Value Date   NA 142 05/13/2020   K 4.5 05/13/2020   CO2 26 05/13/2020   GLUCOSE 109 (H) 05/13/2020   BUN 13 05/13/2020   CREATININE 0.71 05/13/2020   BILITOT 0.3 05/13/2020   ALKPHOS 68 05/13/2020   AST 21 05/13/2020   ALT 15 05/13/2020   PROT 7.0 05/13/2020   ALBUMIN 4.5 05/13/2020   CALCIUM 9.6 05/13/2020   Lab Results  Component Value Date   CHOL 223 (H) 05/13/2020   Lab Results  Component Value Date   HDL 59  05/13/2020   Lab Results  Component Value Date   LDLCALC 140 (H) 05/13/2020   Lab Results  Component Value Date   TRIG 133 05/13/2020   Lab Results  Component Value Date   CHOLHDL 3.8 05/13/2020   Lab Results  Component Value Date   HGBA1C 5.6 05/13/2020      Assessment & Plan:   Problem List Items Addressed This Visit      Respiratory   Chronic obstructive pulmonary disease (HCC)    This is a new Dx today, based on recent PFT result 07/14/20. FEV1 %pred 90%>>Improved +11% after BD (insignificant) FEV1/FVC% :60% TLC: elevated at 156% Pred RV elevated at 238% Pred  PFT result consistent with COPD, emphysema. Ddiscussed with patient and will start her on COPD medications. CAT score with low impact but she will still need LAMA in addition to PRN SABA.    - Albuterol inhaler; Inhale 2 puffs into the lungs every 6 (six) hours as needed for wheezing or shortness of breath.  - SPIRIVA 2.5 MCG/ACT: Inhale 2 puffs into the lungs daily.  -She states that she does not smoke currently (quite before) -F/u in clinic in 2 months or sooner as needed or if concern        Relevant Medications   albuterol (VENTOLIN HFA) 108 (90 Base) MCG/ACT inhaler   Tiotropium Bromide Monohydrate (SPIRIVA RESPIMAT) 2.5 MCG/ACT AERS     Other   Dyspnea on exertion - Primary    Patient presented for follow up of chronic dyspnea and PFT result. The PFT finding shows obstructive patient consistent with COPD- emphysema. Discussed finding with patient and will start appropriate Tx for COPD. Please see the note under "COPD' in problem list      Relevant Medications   albuterol (VENTOLIN HFA) 108 (90 Base) MCG/ACT inhaler   Tiotropium Bromide Monohydrate (SPIRIVA RESPIMAT) 2.5 MCG/ACT AERS   Rib pain on right side    Patient reports some right side rib pain for past couple of weeks (or months?). Denies any fall. It hurts mostly when she moves.  She has some tenderness on rt side of her chest (the same  area No percutaneous emphysema and no skin changes. Prior CXR without any rib Fx. Her reproducible pain seems to be musculoskeletal, likely costochondritis. Will prescribe Volatren gel. -Volataren gel PRN       Relevant  Medications   diclofenac Sodium (VOLTAREN) 1 % GEL      Meds ordered this encounter  Medications  . diclofenac Sodium (VOLTAREN) 1 % GEL    Sig: Apply 4 g topically 4 (four) times daily.    Dispense:  4 g    Refill:  0  . albuterol (VENTOLIN HFA) 108 (90 Base) MCG/ACT inhaler    Sig: Inhale 2 puffs into the lungs every 6 (six) hours as needed for wheezing or shortness of breath.    Dispense:  8 g    Refill:  2  . Tiotropium Bromide Monohydrate (SPIRIVA RESPIMAT) 2.5 MCG/ACT AERS    Sig: Inhale 2 puffs into the lungs daily.    Dispense:  1 each    Refill:  2    Follow-up: No follow-ups on file.    Chevis Pretty, MD

## 2020-07-26 ENCOUNTER — Encounter: Payer: Self-pay | Admitting: Internal Medicine

## 2020-07-26 DIAGNOSIS — J449 Chronic obstructive pulmonary disease, unspecified: Secondary | ICD-10-CM

## 2020-07-26 HISTORY — DX: Chronic obstructive pulmonary disease, unspecified: J44.9

## 2020-07-26 NOTE — Assessment & Plan Note (Signed)
This is a new Dx today, based on recent PFT result 07/14/20. FEV1 %pred 90%>>Improved +11% after BD (insignificant) FEV1/FVC% :60% TLC: elevated at 156% Pred RV elevated at 238% Pred  PFT result consistent with COPD, emphysema. Ddiscussed with patient and will start her on COPD medications. CAT score with low impact but she will still need LAMA in addition to PRN SABA.    - Albuterol inhaler; Inhale 2 puffs into the lungs every 6 (six) hours as needed for wheezing or shortness of breath.  - SPIRIVA 2.5 MCG/ACT: Inhale 2 puffs into the lungs daily.  -She states that she does not smoke currently (quite before) -F/u in clinic in 2 months or sooner as needed or if concern

## 2020-07-26 NOTE — Assessment & Plan Note (Signed)
Patient presented for follow up of chronic dyspnea and PFT result. The PFT finding shows obstructive patient consistent with COPD- emphysema. Discussed finding with patient and will start appropriate Tx for COPD. Please see the note under "COPD' in problem list

## 2020-07-26 NOTE — Assessment & Plan Note (Signed)
Patient reports some right side rib pain for past couple of weeks (or months?). Denies any fall. It hurts mostly when she moves.  She has some tenderness on rt side of her chest (the same area No percutaneous emphysema and no skin changes. Prior CXR without any rib Fx. Her reproducible pain seems to be musculoskeletal, likely costochondritis. Will prescribe Volatren gel. -Volataren gel PRN

## 2020-07-27 NOTE — Progress Notes (Signed)
Internal Medicine Clinic Attending ° °Case discussed with Dr. Aslam  At the time of the visit.  We reviewed the resident’s history and exam and pertinent patient test results.  I agree with the assessment, diagnosis, and plan of care documented in the resident’s note.  °

## 2020-08-17 NOTE — Congregational Nurse Program (Signed)
  Dept: [phone]   Congregational Nurse Program Note  Date of Encounter: 08/17/2020  Past Medical History: Past Medical History:  Diagnosis Date  . Cataract   . Chronic obstructive pulmonary disease (HCC) 07/26/2020    Encounter Details: Client brought in prescription from last doctor visit.Client was informed to take prescription to the pharmacy. Client will return so I can show them how to use Inhaler properly.   Arman Bogus RN BSn [facility]  [facility] [phone]-cell [phone]-office

## 2020-08-18 NOTE — Congregational Nurse Program (Signed)
Patient had questions about how to use prescribed spiriva inhaler. Demonstrated set up and use of inhaler for patient and son. Discussed frequency of use and dose. Patient demonstrated understanding by taking one dose. Son verbalized understanding.    Demonstrated understanding of how to use albuterol inhaler. Discussed different uses of each inhaler.   Demonstrated use of voltaren gel for neck pain.   Discussed incidence of headache and eye fatigue after using current glasses. Provided with pair of replacement reading glasses, 2.75. Advised that medicaid provides one yearly eye appointment.  [name] Elnathan Fulford RN BSn [facility]  [facility] [phone]-cell [phone]-office   .

## 2020-08-30 ENCOUNTER — Other Ambulatory Visit: Payer: Self-pay

## 2020-08-30 ENCOUNTER — Ambulatory Visit (HOSPITAL_COMMUNITY)
Admission: RE | Admit: 2020-08-30 | Discharge: 2020-08-30 | Disposition: A | Discharge status: Home / Self Care | Payer: Medicaid Other | Source: Ambulatory Visit | Attending: Internal Medicine | Admitting: Internal Medicine

## 2020-08-30 ENCOUNTER — Telehealth: Payer: Self-pay

## 2020-08-30 DIAGNOSIS — R1011 Right upper quadrant pain: Secondary | ICD-10-CM | POA: Diagnosis present

## 2020-08-30 NOTE — Telephone Encounter (Signed)
I discussed this with Dr. Karilyn Cota who will call the patient/family and discuss these results as well as the need for further work up with an MRI. If possible malignancy will need in person discussion with patient/family as to how aggressive they would like to be

## 2020-08-30 NOTE — Telephone Encounter (Signed)
Received a TC from [place] at [facility] Radiology wanting to make sure MD looks at results from today's US Abdomen Limited RUQ (Liver/GB)  Forwarding to [facility] team and attendings. Thank you, SChaplin, RN,BSN

## 2020-08-30 NOTE — Telephone Encounter (Signed)
Attempted calling patient, no answer and voicemail box was full. Will try again tomorrow.

## 2020-09-08 ENCOUNTER — Other Ambulatory Visit: Payer: Self-pay

## 2020-09-08 DIAGNOSIS — R682 Dry mouth, unspecified: Secondary | ICD-10-CM

## 2020-09-08 LAB — GLUCOSE, POCT (MANUAL RESULT ENTRY): POC Glucose: 103 mg/dl — AB (ref 70–99)

## 2020-09-08 NOTE — Congregational Nurse Program (Signed)
  Dept: [phone]   Congregational Nurse Program Note  Date of Encounter: 09/08/2020  Past Medical History: Past Medical History:  Diagnosis Date  . Cataract   . Chronic obstructive pulmonary disease (HCC) 07/26/2020    Encounter Details:  CNP Questionnaire - 09/08/20 1213      Questionnaire   Do you give verbal consent to treat you today? Yes    Visit Setting Church or Organization    Location Patient Served At [facility]    Patient Status Refugee    Medical Provider Yes    Insurance Medicaid    Intervention Advocate;Assess (including screenings);Counsel;Educate;Support    Transportation Need transportation assistance    ED Visit Averted Yes         Client is c/o dry mouth despite consuming enough  Amount of water. Blood sugar done = 103. She has an appointment 09/24/20 and advised to discuss dry mouth symptoms with the provider. Noted results of abdominal u/sound. Same NOT discussed with the patient. Encouraged patient not to miss appointment and to discuss ultrasound results with provider.  Arman Bogus RN BSn [facility]  [facility] [phone]-cell [phone]-office

## 2020-09-22 NOTE — Congregational Nurse Program (Signed)
Check blood pressure, within patient's usual.  Arman Bogus RN BSn [facility]  [facility] [phone]-cell [phone]-office

## 2020-09-24 ENCOUNTER — Ambulatory Visit: Payer: Medicaid Other | Admitting: Student

## 2020-09-24 ENCOUNTER — Encounter: Payer: Self-pay | Admitting: Student

## 2020-09-24 DIAGNOSIS — K769 Liver disease, unspecified: Secondary | ICD-10-CM | POA: Diagnosis present

## 2020-09-24 DIAGNOSIS — R030 Elevated blood-pressure reading, without diagnosis of hypertension: Secondary | ICD-10-CM

## 2020-09-24 DIAGNOSIS — R1011 Right upper quadrant pain: Secondary | ICD-10-CM | POA: Diagnosis not present

## 2020-09-24 DIAGNOSIS — J449 Chronic obstructive pulmonary disease, unspecified: Secondary | ICD-10-CM | POA: Diagnosis not present

## 2020-09-24 NOTE — Patient Instructions (Addendum)
It was a pleasure seeing you in clinic. Today we discussed:   Right sided pain: We will need to do a MRI to take a better look at the 2 abnormal areas on your liver.  Blood pressure: Your blood pressure is high today we will recheck it at you next visit.  Please follow up in 1 month for BP recheck and to review MRI results  If you have any questions or concerns, please call our clinic at [phone] between 9am-5pm and after hours call [phone] and ask for the internal medicine resident on call. If you feel you are having a medical emergency please call 911.   Thank you, we look forward to helping you remain healthy!

## 2020-09-25 DIAGNOSIS — R030 Elevated blood-pressure reading, without diagnosis of hypertension: Secondary | ICD-10-CM | POA: Insufficient documentation

## 2020-09-25 DIAGNOSIS — I10 Essential (primary) hypertension: Secondary | ICD-10-CM | POA: Insufficient documentation

## 2020-09-25 NOTE — Assessment & Plan Note (Signed)
Patient notes she has been using inhalers as instructed. Notes she takes albuterol on weekdays when she has classes and uses it most days. Notes her symptoms are well controlled denies frequent cough or breathlessness. She is able to complete her daily activities with minimal difficulty. Will continue her on current inhaler regimen at this time.

## 2020-09-25 NOTE — Assessment & Plan Note (Addendum)
Patient continues to have mild RUQ pain that radiates up towards her right ribs and shoulder. 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 Discussed [place] results with her and that the two lesions may be causing her the pain she is having. Discussed it is difficult to tell what is causing this and that further imaging with MRI is recommended. She understand and agreeable to further imaging.  - MRI abdomen ordered - Follow up in 1 months pending MRI results

## 2020-09-25 NOTE — Progress Notes (Signed)
   CC: RUQ pain follow up  HPI:  [name] is a 80 y.o. female presents for follow up of RUQ pain and COPD. Interpreter present during office visit. Please refer to problem based charting for further details and assessment and plan of current problem and chronic medical conditions.   Past Medical History:  Diagnosis Date  . Cataract   . Chronic obstructive pulmonary disease (HCC) 07/26/2020   Review of Systems:  Negative as per HPI  Physical Exam:  Vitals:   09/24/20 1028 09/24/20 1040  BP: (!) 173/102 (!) 176/99  Pulse: 76 77  Temp: 98.3 F (36.8 C)   TempSrc: Oral   SpO2: 99%   Weight: 120 lb (54.4 kg)   Height: 5\' 2"  (1.575 m)    Constitutional: Appears well.. No distress.  HENT: Normocephalic and atraumatic, EOMI, conjunctiva normal, moist mucous membranes Cardiovascular: Normal rate, regular rhythm, S1 and S2 present, no murmurs, rubs, gallops.  Distal pulses intact Respiratory: No respiratory distress, no accessory muscle use.  Effort is normal.  Lungs are clear to auscultation bilaterally. No wheezing or rhonchi. GI: Nondistended, soft, nontender to palpation, normal active bowel sounds Musculoskeletal: Normal bulk and tone.  No peripheral edema noted. Neurological: Is alert and oriented x4, no apparent focal deficits noted. Skin: Warm and dry.   Psychiatric: Normal mood and affect.  Assessment & Plan:   See Encounters Tab for problem based charting.  Patient discussed with Dr. 

## 2020-09-25 NOTE — Assessment & Plan Note (Signed)
BP Readings from Last 3 Encounters:  09/24/20 (!) 176/99  09/22/20 (!) 147/88  09/08/20 131/86    BP noted to be elevated during office visit today. Patient is asymptomatic and reports no recent changed besides starting inhalers. On chart review patient's BP has mostly been in the normal range. Will have her follow up in 1 month for BP recheck.

## 2020-10-01 NOTE — Progress Notes (Signed)
Internal Medicine Clinic Attending ? ?Case discussed with Dr. Liang  At the time of the visit.  We reviewed the resident?s history and exam and pertinent patient test results.  I agree with the assessment, diagnosis, and plan of care documented in the resident?s note. ? ?

## 2020-10-13 NOTE — Congregational Nurse Program (Signed)
  Dept: [phone]   Congregational Nurse Program Note  Date of Encounter: 10/13/2020  Past Medical History: Past Medical History:  Diagnosis Date  . Cataract   . Chronic obstructive pulmonary disease (HCC) 07/26/2020    Encounter Details:  Cardiac Echo testing rescheduled to June 28th at 9 am per patient request.  Arman Bogus RN BSn [facility]  [facility] [phone]-office

## 2020-11-02 NOTE — Congregational Nurse Program (Signed)
  Dept: [phone]   Congregational Nurse Program Note  Date of Encounter: 11/02/2020  Past Medical History: Past Medical History:  Diagnosis Date  . Cataract   . Chronic obstructive pulmonary disease (HCC) 07/26/2020    Encounter Details:  Patient would like to reschedule MRI appointment  to morning hours  due to transportation issues.I have called [facility] and rescheduled per patient request. Arman Bogus RN BSn [facility]  [facility] [phone]-office

## 2020-11-04 ENCOUNTER — Ambulatory Visit (HOSPITAL_COMMUNITY): Admission: RE | Admit: 2020-11-04 | Payer: Medicaid Other | Source: Ambulatory Visit

## 2020-11-09 NOTE — Congregational Nurse Program (Signed)
  Dept: [phone]   Congregational Nurse Program Note  Date of Encounter: 11/09/2020  Past Medical History: Past Medical History:  Diagnosis Date   Cataract    Chronic obstructive pulmonary disease (HCC) 07/26/2020    Encounter Details:  Ms Shantae is complaining of low back pain x2 days. She is using voltaren gel with minimal relief. Advised on trying warm compresses for pain relief.  She would like to try NSAID OTC and I have directed her to pharmacy.  Education provided regarding use of NSAID.  Arman Bogus RN BSn [facility]  [facility] [phone]-cell [phone]-office

## 2020-11-19 ENCOUNTER — Other Ambulatory Visit (HOSPITAL_COMMUNITY): Payer: Medicaid Other

## 2020-11-19 ENCOUNTER — Ambulatory Visit (HOSPITAL_COMMUNITY): Admission: RE | Admit: 2020-11-19 | Payer: Medicaid Other | Source: Ambulatory Visit

## 2020-11-23 ENCOUNTER — Other Ambulatory Visit: Payer: Self-pay | Admitting: Internal Medicine

## 2020-11-23 ENCOUNTER — Ambulatory Visit (HOSPITAL_COMMUNITY)
Admission: RE | Admit: 2020-11-23 | Discharge: 2020-11-23 | Disposition: A | Discharge status: Home / Self Care | Payer: Medicaid Other | Source: Ambulatory Visit | Attending: Internal Medicine | Admitting: Internal Medicine

## 2020-11-23 ENCOUNTER — Other Ambulatory Visit: Payer: Self-pay

## 2020-11-23 DIAGNOSIS — R160 Hepatomegaly, not elsewhere classified: Secondary | ICD-10-CM

## 2020-11-23 DIAGNOSIS — R0609 Other forms of dyspnea: Secondary | ICD-10-CM

## 2020-11-23 DIAGNOSIS — R0600 Dyspnea, unspecified: Secondary | ICD-10-CM

## 2020-11-23 LAB — ECHOCARDIOGRAM COMPLETE
AR max vel: 1.66 cm2
AV Area VTI: 1.9 cm2
AV Area mean vel: 1.63 cm2
AV Mean grad: 3 mmHg
AV Peak grad: 5.1 mmHg
Ao pk vel: 1.13 m/s
Area-P 1/2: 3.37 cm2
S' Lateral: 2.6 cm

## 2020-11-23 NOTE — Assessment & Plan Note (Signed)
Echo completed today. Normal EF. Aortic stenosis present. No other significant findings.

## 2020-11-23 NOTE — Assessment & Plan Note (Signed)
I checked into the status of the MRI that was ordered in April and has not been completed.  MRI had initially been approved by [facility] however patient had canceled it. Repeat attempts for medicaid approval have been denied.  Will run it through her insurance again. She requires the MRI for follow up of 2 discrete liver lesions noted on RUQ [place] in April.  Will make sure she has follow up with Dr. Elaina Pattee in September to ensure completion of MRI.

## 2020-11-24 NOTE — Congregational Nurse Program (Signed)
  Dept: [phone]   Congregational Nurse Program Note  Date of Encounter: 11/24/2020  Past Medical History: Past Medical History:  Diagnosis Date   Cataract    Chronic obstructive pulmonary disease (HCC) 07/26/2020    Encounter Details:  Patient came in for blood pressure check. Same done. She is also requesting assistance to reschedule MRI. I have contacted MRI department at [facility].Per MRI patient needs a new authorization because the previous one expired. I will reach out to MD.  Arman Bogus RN BSN PCCN [phone]

## 2020-12-01 NOTE — Congregational Nurse Program (Signed)
Patient came in for blood pressure check. Same done. I have contacted [facility] Scheduling and MRI has been scheduled for August 5th at 0900 am. Patient to be NPO 4 hours prior and to arrive by 0830.  Arman Bogus RN BSn PCCN  [facility] [phone]-office

## 2020-12-31 ENCOUNTER — Ambulatory Visit (HOSPITAL_COMMUNITY): Admission: RE | Admit: 2020-12-31 | Payer: Medicaid Other | Source: Ambulatory Visit

## 2021-01-09 ENCOUNTER — Other Ambulatory Visit: Payer: Self-pay

## 2021-01-09 ENCOUNTER — Ambulatory Visit (HOSPITAL_COMMUNITY)
Admission: RE | Admit: 2021-01-09 | Discharge: 2021-01-09 | Disposition: A | Discharge status: Home / Self Care | Payer: Medicaid Other | Source: Ambulatory Visit | Attending: Internal Medicine | Admitting: Internal Medicine

## 2021-01-09 DIAGNOSIS — K769 Liver disease, unspecified: Secondary | ICD-10-CM | POA: Diagnosis not present

## 2021-01-09 MED ORDER — GADOBUTROL 1 MMOL/ML IV SOLN
5.0000 mL | Freq: Once | INTRAVENOUS | Status: AC | PRN
  Administered 2021-01-09: 5 mL via INTRAVENOUS

## 2021-01-25 ENCOUNTER — Encounter: Payer: Medicaid Other | Admitting: Student

## 2021-01-27 ENCOUNTER — Encounter: Payer: Medicaid Other | Admitting: Student

## 2021-01-27 ENCOUNTER — Telehealth: Payer: Self-pay | Admitting: Student

## 2021-01-27 NOTE — Telephone Encounter (Signed)
Called patient at home phone and reached her son. Patient is at class and unable to speak. Discussed that patient missed OV today. Son states they were unsure if visit today was for him or his mother. Will reschedule her appointment to follow up her BP and RUQ pain. Son is agreeable and will replay message to his mother.

## 2021-02-01 NOTE — Congregational Nurse Program (Signed)
  Dept: [phone]   Congregational Nurse Program Note  Date of Encounter: 02/01/2021  Past Medical History: Past Medical History:  Diagnosis Date   Cataract    Chronic obstructive pulmonary disease (HCC) 07/26/2020    Encounter Details:  Alyssa Hammond is a 80 yo woman presents today for follow up of RUQ abdominal pain. She states that the pain has persisted and is associated with bloating. She denies any nausea, vomiting. Per chart review, workup including [place] and MR of abdominal pain were unremarkable. Advised to follow up with PCP Dr. Elaina Pattee on 9/12.   Patient also reports difficulty with medication refills. She was supposed to pick up inhalers but was unable to do so. I will call pharmacy for refills.   Arman Bogus RN BSn PCCN  [facility] [phone]-office

## 2021-02-07 ENCOUNTER — Ambulatory Visit: Payer: Medicaid Other | Admitting: Internal Medicine

## 2021-02-07 ENCOUNTER — Other Ambulatory Visit: Payer: Self-pay

## 2021-02-07 VITALS — BP 162/88 | HR 72 | Temp 98.3°F | Ht 65.0 in | Wt 125.0 lb

## 2021-02-07 DIAGNOSIS — R1011 Right upper quadrant pain: Secondary | ICD-10-CM

## 2021-02-07 DIAGNOSIS — R030 Elevated blood-pressure reading, without diagnosis of hypertension: Secondary | ICD-10-CM | POA: Diagnosis present

## 2021-02-07 DIAGNOSIS — I10 Essential (primary) hypertension: Secondary | ICD-10-CM | POA: Diagnosis not present

## 2021-02-07 DIAGNOSIS — R160 Hepatomegaly, not elsewhere classified: Secondary | ICD-10-CM

## 2021-02-07 DIAGNOSIS — J449 Chronic obstructive pulmonary disease, unspecified: Secondary | ICD-10-CM

## 2021-02-07 MED ORDER — AMLODIPINE BESYLATE 2.5 MG PO TABS: 2.5000 mg | ORAL_TABLET | Freq: Every day | ORAL | 11 refills | Status: DC

## 2021-02-07 NOTE — Assessment & Plan Note (Addendum)
Vitals:   02/07/21 0937 02/07/21 0953  BP: (!) 168/79 (!) 162/88    Blood pressure elevated again at today's visit. Patient endorses shortness of breath.  Denies any chest pain, headaches or changes in vision.  Discussed starting an antihypertensive today and patient is agreeable to this plan.  Plan: -Start amlodipine 2.5 mg -Follow-up in 4 to 6 weeks for BP check

## 2021-02-07 NOTE — Progress Notes (Addendum)
   CC: HTN, liver lesion  HPI:  [name] is a 80 y.o. with a past medical history listed below presenting for blood pressure check and to discuss her lumbar MRI results. For details of today's visit and the status of his chronic medical issues please refer to the assessment and plan.   Past Medical History:  Diagnosis Date   Cataract    Chronic obstructive pulmonary disease (HCC) 07/26/2020   Review of Systems: Negative except as per assessment and plan  Physical Exam:  Vitals:   02/07/21 0937  BP: (!) 168/79  Pulse: 78  Temp: 98.3 F (36.8 C)  TempSrc: Oral  SpO2: 96%  Weight: 125 lb (56.7 kg)  Height: 5\' 5"  (1.651 m)   Physical Exam General: alert, appears stated age, in no acute distress HEENT: Normocephalic, atraumatic, EOM intact, conjunctiva normal CV: Regular rate and rhythm, no murmurs rubs or gallops Pulm: Clear to auscultation bilaterally, normal work of breathing Abdomen: Soft, nondistended, bowel sounds present, no tenderness to palpation MSK: No lower extremity edema Skin: Warm and dry Neuro: Alert and oriented x3   Assessment & Plan:   See Encounters Tab for problem based charting.  Patient discussed with Dr. 

## 2021-02-07 NOTE — Assessment & Plan Note (Signed)
Patient states that she is continued to have shortness of breath despite using her inhalers.  She states her shortness of breath is mostly troublesome on exertion.  Discussed that this may be due to her uncontrolled blood pressure.  She also states that she is been having trouble using her Spiriva inhaler.  Plan to consult our clinical pharmacist to help find alternatives that may be easier to use than Spiriva.  Plan: Refer to our clinical pharmacist for assistance finding on her Spiriva which may be easier to use

## 2021-02-07 NOTE — Patient Instructions (Signed)
Start taking amlodipine 2.5 mg daily. Follow up in 4 weeks.

## 2021-02-07 NOTE — Assessment & Plan Note (Signed)
MRI shows no suspicious liver lesions.  Previous lesions consistent with meningioma.  No further recommendations.

## 2021-02-07 NOTE — Assessment & Plan Note (Signed)
Patient continues to have right upper quadrant pain that radiates towards her right ribs and right shoulder blade.  She also endorses epigastric pain.  She states the pain is worse with food.  She has had an extensive work-up and imaging all which has been relatively unremarkable.  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  Patient does report that she fell several years ago on her right side and had severe right wrist pain and right-sided pain.  She does not recall if she had any rib fractures at that time.  She is wondering if this could be contributing to her right-sided pain.  She also states that Voltaren gel has helped alleviate her pain some.  Assessment/plan: Unclear etiology of patient's right upper quadrant/right-sided pain.  Work-up has been negative for gallbladder disease, pancreatitis, H. pylori a and hepatitis.  Recommended she continue using conservative managements that have helped control her pain such as Voltaren gel as well as IcyHot or Biofreeze.  Will continue to monitor

## 2021-02-08 NOTE — Congregational Nurse Program (Signed)
  Dept: [phone]   Congregational Nurse Program Note  Date of Encounter: 02/08/2021  Past Medical History: Past Medical History:  Diagnosis Date   Cataract    Chronic obstructive pulmonary disease (HCC) 07/26/2020    Encounter Details:  Patient came in for blood pressure check. First reading was 170/90 and second reading after resting was 136/92. She was seen yesterday by PCP and prescribed norvasc. I called pharmacy and medication is ready for pick up. I also requested refill for her inhalers.   Arman Bogus RN BSn PCCN  [facility] [phone]-cell [phone]-office

## 2021-02-14 NOTE — Progress Notes (Signed)
Internal Medicine Clinic Attending ° °Case discussed with Dr. Rehman  At the time of the visit.  We reviewed the resident’s history and exam and pertinent patient test results.  I agree with the assessment, diagnosis, and plan of care documented in the resident’s note.  ° °

## 2021-02-22 NOTE — Congregational Nurse Program (Signed)
  Dept: [phone]   Congregational Nurse Program Note  Date of Encounter: 02/22/2021  Past Medical History: Past Medical History:  Diagnosis Date   Cataract    Chronic obstructive pulmonary disease (HCC) 07/26/2020    Encounter Details:  Patient presents today with a follow-up of her hypertension and she is requesting assistance with using her inhaler for her COPD. Her BP is improved today and patient notes that she has been taking her Norvasc daily as instructed. She was provided education on how to use her albuterol inhaler and patient voiced understanding.   [name] Nocholas Damaso RN BSn PCCN  [facility] [phone]-cell [phone]-office

## 2021-02-28 ENCOUNTER — Ambulatory Visit: Payer: Medicaid Other | Admitting: Internal Medicine

## 2021-02-28 ENCOUNTER — Encounter: Payer: Self-pay | Admitting: Internal Medicine

## 2021-02-28 VITALS — BP 157/96 | HR 78 | Temp 97.7°F | Wt 125.7 lb

## 2021-02-28 DIAGNOSIS — J449 Chronic obstructive pulmonary disease, unspecified: Secondary | ICD-10-CM | POA: Diagnosis not present

## 2021-02-28 DIAGNOSIS — K219 Gastro-esophageal reflux disease without esophagitis: Secondary | ICD-10-CM | POA: Diagnosis not present

## 2021-02-28 DIAGNOSIS — M5432 Sciatica, left side: Secondary | ICD-10-CM | POA: Insufficient documentation

## 2021-02-28 DIAGNOSIS — I10 Essential (primary) hypertension: Secondary | ICD-10-CM | POA: Diagnosis present

## 2021-02-28 DIAGNOSIS — Z0000 Encounter for general adult medical examination without abnormal findings: Secondary | ICD-10-CM

## 2021-02-28 DIAGNOSIS — R350 Frequency of micturition: Secondary | ICD-10-CM | POA: Diagnosis not present

## 2021-02-28 MED ORDER — CALCIUM CARBONATE ANTACID 500 MG PO CHEW: 1.0000 | CHEWABLE_TABLET | Freq: Every day | ORAL | 0 refills | Status: AC

## 2021-02-28 MED ORDER — GABAPENTIN 300 MG PO CAPS: 300.0000 mg | ORAL_CAPSULE | Freq: Every day | ORAL | 0 refills | Status: DC

## 2021-02-28 MED ORDER — AMLODIPINE BESYLATE 5 MG PO TABS: 5.0000 mg | ORAL_TABLET | Freq: Every day | ORAL | 11 refills | Status: DC

## 2021-02-28 NOTE — Assessment & Plan Note (Addendum)
Patient claims shortness of breath with exertion.  Patient states she is compliant with her prescribed Spiriva and albuterol inhalers.  She states these inhalers relieve the shortness of breath.  Patient does not desire to change her current regimen. Patient had recent echo--June 2022 revealed EF 65 to 70% and no valvulopathy.   PLAN: Continue inhalers as directed

## 2021-02-28 NOTE — Assessment & Plan Note (Signed)
Patient has history of 7 vaginal births.  He reports urinary urgency.  Patient request to see a gynecologist because she has not seen anyone in a very long time to address concerns. Patient reports blurry vision and outdated eyeglasses, patient requesting referral to opthalmologist.   PLAN: Referral to OB/GYN Referrals ophthalmologist

## 2021-02-28 NOTE — Patient Instructions (Addendum)
Blood pressure medication dose increased to 5mg  daily. Take two pills of the medication you have left.  Once that prescription is finished pick up the new prescription of 5 mg amlodipine and take that once daily. Continue taking your Spivira and albuterol inhalers when you have shortness of breath. Take antinausea medication when you have heartburn. I prescribed you a medication called gabapentin for your nerve pain that she is feeling especially down to her left leg.  Take it at night only. I referred you to an eye doctor to help to replace your glasses and help with your blurry vision. I referred you to a gynecologist so you can have a checkup and you can let them know the symptoms that you have.

## 2021-02-28 NOTE — Progress Notes (Signed)
   CC: follow up; SOB  HPI:  [name] is a 80 y.o. female with a past medical history stated below and presents today for CC listed above.  Patient is accompanied by interpreter.  Please see problem based assessment and plan for additional details.  Past Medical History:  Diagnosis Date   Cataract    Chronic obstructive pulmonary disease (HCC) 07/26/2020    Current Outpatient Medications on File Prior to Visit  Medication Sig Dispense Refill   albuterol (VENTOLIN HFA) 108 (90 Base) MCG/ACT inhaler Inhale 2 puffs into the lungs every 6 (six) hours as needed for wheezing or shortness of breath. 8 g 2   amLODipine (NORVASC) 2.5 MG tablet Take 1 tablet (2.5 mg total) by mouth daily. 30 tablet 11   diclofenac Sodium (VOLTAREN) 1 % GEL Apply 4 g topically 4 (four) times daily. 4 g 0   Tiotropium Bromide Monohydrate (SPIRIVA RESPIMAT) 2.5 MCG/ACT AERS Inhale 2 puffs into the lungs daily. 1 each 2   No current facility-administered medications on file prior to visit.    Family History  Family history unknown: Yes    Social History   Socioeconomic History   Marital status: Unknown    Spouse name: Not on file   Number of children: Not on file   Years of education: Not on file   Highest education level: Not on file  Occupational History   Not on file  Tobacco Use   Smoking status: Former    Packs/day: 0.25    Years: 40.00    Pack years: 10.00    Types: Cigarettes   Smokeless tobacco: Never   Tobacco comments:    stopped years ago  Substance and Sexual Activity   Alcohol use: Not Currently   Drug use: Never   Sexual activity: Not on file  Other Topics Concern   Not on file  Social History Narrative   Not on file   Social Determinants of Health   Financial Resource Strain: Not on file  Food Insecurity: Not on file  Transportation Needs: Not on file  Physical Activity: Not on file  Stress: Not on file  Social Connections: Not on file  Intimate Partner  Violence: Not on file    Review of Systems: ROS negative except for what is noted on the assessment and plan.  Vitals:   02/28/21 1425  BP: (!) 157/96  Pulse: 78  Temp: 97.7 F (36.5 C)  TempSrc: Oral  SpO2: 98%  Weight: 125 lb 11.2 oz (57 kg)     Physical Exam: Constitutional: well-appearing elderly woman sitting in chair, in no acute distress HENT: normocephalic atraumatic, mucous membranes moist Eyes: conjunctiva non-erythematous Neck: supple Cardiovascular: regular rate and rhythm, no m/r/g Pulmonary/Chest: normal work of breathing on room air, lungs clear to auscultation bilaterally Abdominal: soft, non-tender, non-distended MSK: normal bulk and tone Neurological: alert & oriented x 3, 5/5 strength in bilateral upper and lower extremities, normal gait Skin: warm and dry Psych: normal mood, normal behavior   Assessment & Plan:   See Encounters Tab for problem based charting.  Patient seen with Dr. Marylene Buerger, M.D. [facility] Internal Medicine, PGY-1 Pager: [phone], Phone: [phone] Date 02/28/2021 Time 3:28 PM

## 2021-02-28 NOTE — Assessment & Plan Note (Addendum)
Patient currently on amlodipine 2.5 mg for blood pressure control.  Patient states she has been compliant with medication.  However, patient's blood pressure is consistently elevated.  Today BP reading was 157/96 on repeat check.  Patient denies headache.   PLAN: Amlodipine increased to 5 daily Follow-up in 3 to reassess blood pressure management BMP check

## 2021-03-01 LAB — URINALYSIS, ROUTINE W REFLEX MICROSCOPIC
Bilirubin, UA: NEGATIVE
Glucose, UA: NEGATIVE
Ketones, UA: NEGATIVE
Nitrite, UA: NEGATIVE
Protein,UA: NEGATIVE
RBC, UA: NEGATIVE
Specific Gravity, UA: 1.014 (ref 1.005–1.030)
Urobilinogen, Ur: 0.2 mg/dL (ref 0.2–1.0)
pH, UA: 7 (ref 5.0–7.5)

## 2021-03-01 LAB — MICROSCOPIC EXAMINATION
Bacteria, UA: NONE SEEN
Casts: NONE SEEN /lpf

## 2021-03-01 LAB — BMP8+ANION GAP
Anion Gap: 15 mmol/L (ref 10.0–18.0)
BUN/Creatinine Ratio: 18 (ref 12–28)
BUN: 12 mg/dL (ref 8–27)
CO2: 25 mmol/L (ref 20–29)
Calcium: 9.6 mg/dL (ref 8.7–10.3)
Chloride: 100 mmol/L (ref 96–106)
Creatinine, Ser: 0.67 mg/dL (ref 0.57–1.00)
Glucose: 88 mg/dL (ref 70–99)
Potassium: 4.4 mmol/L (ref 3.5–5.2)
Sodium: 140 mmol/L (ref 134–144)
eGFR: 89 mL/min/{1.73_m2} (ref >59)

## 2021-03-01 NOTE — Assessment & Plan Note (Signed)
Patient reports left-sided back pain that radiates down the left leg.  Reports associated numbness of the left leg when standing for long periods of time.  He denies tingling of the lower extremities.  Denies any recent falls.  Patient declines physical therapy at this time.  Patient states she wakes up in the morning and stretches and walks around with partial relief.    PLAN: Gabapentin 300 mg daily at bedtime resuggest physical therapy, follow-up visit

## 2021-03-02 NOTE — Progress Notes (Signed)
Internal Medicine Clinic Attending  I saw and evaluated the patient.  I personally confirmed the key portions of the history and exam documented by Dr. Ariwodo and I reviewed pertinent patient test results.  The assessment, diagnosis, and plan were formulated together and I agree with the documentation in the resident's note.   

## 2021-03-16 NOTE — Congregational Nurse Program (Signed)
Patient came in for blood pressure check. Today reading 102/66. HR 95. She reports to be compliant with her medication. Reminded her of scheduled appointment with PCP on 03/21/21 at 10:15am.  Arman Bogus RN BSn PCCN  [facility] [phone] [phone]-office

## 2021-03-21 ENCOUNTER — Encounter: Payer: Self-pay | Admitting: Internal Medicine

## 2021-03-21 ENCOUNTER — Ambulatory Visit: Payer: Medicaid Other | Admitting: Internal Medicine

## 2021-03-21 ENCOUNTER — Encounter: Payer: Medicaid Other | Admitting: Pharmacist

## 2021-03-21 ENCOUNTER — Other Ambulatory Visit: Payer: Self-pay

## 2021-03-21 VITALS — BP 125/77 | HR 91 | Temp 97.7°F | Resp 28 | Ht 65.0 in | Wt 129.8 lb

## 2021-03-21 DIAGNOSIS — I10 Essential (primary) hypertension: Secondary | ICD-10-CM | POA: Diagnosis not present

## 2021-03-21 DIAGNOSIS — M5432 Sciatica, left side: Secondary | ICD-10-CM

## 2021-03-21 DIAGNOSIS — R350 Frequency of micturition: Secondary | ICD-10-CM

## 2021-03-21 DIAGNOSIS — Z0000 Encounter for general adult medical examination without abnormal findings: Secondary | ICD-10-CM | POA: Diagnosis not present

## 2021-03-21 DIAGNOSIS — R1011 Right upper quadrant pain: Secondary | ICD-10-CM | POA: Diagnosis not present

## 2021-03-21 DIAGNOSIS — Z748 Other problems related to care provider dependency: Secondary | ICD-10-CM | POA: Diagnosis not present

## 2021-03-21 NOTE — Assessment & Plan Note (Signed)
Patient complains of right upper quadrant tenderness.  States this pain has been going on for the past year.  Work-up has been done, right upper quadrant ultrasound and CT reveals benign hemangioma.  Patient denies any injury to her right side.  Patient denies fevers, chills, nausea, vomiting, or diarrhea.  On physical exam, no jaundice or scleral icterus noted.  Recent CMP unremarkable   PLAN: Reassurance Tylenol 500 mg twice daily for pain relief

## 2021-03-21 NOTE — Assessment & Plan Note (Signed)
Patient's blood pressure is currently at goal; during last visit amlodipine was increased from 2.5 mg to 5 mg daily.    PLAN: Continue amlodipine 5 mg daily

## 2021-03-21 NOTE — Assessment & Plan Note (Addendum)
Patient still complaining of left leg pain.  Patiently currently on gabapentin 300 mg at bedtime; patient states partial relief with medication.  Patient declines increase gabapentin at this time.   PLAN: Referral to physical therapy Tylenol 500 mg twice a day for pain relief  Continue gabapentin 300 mg at bedtime

## 2021-03-21 NOTE — Patient Instructions (Signed)
Take Tylenol 2 pills twice daily for pain

## 2021-03-21 NOTE — Progress Notes (Signed)
CC: BP check; left leg pain  HPI:  Alyssa Hammond is a 80 y.o. female with a past medical history stated below and presents today for cc listed above. Interpreter present  Please see problem based assessment and plan for additional details.  Past Medical History:  Diagnosis Date   Cataract    Chronic obstructive pulmonary disease (HCC) 07/26/2020   Dyspnea on exertion 05/13/2020   Hypertension     Current Outpatient Medications on File Prior to Visit  Medication Sig Dispense Refill   albuterol (VENTOLIN HFA) 108 (90 Base) MCG/ACT inhaler Inhale 2 puffs into the lungs every 6 (six) hours as needed for wheezing or shortness of breath. 8 g 2   amLODipine (NORVASC) 5 MG tablet Take 1 tablet (5 mg total) by mouth daily. 30 tablet 11   calcium carbonate (TUMS) 500 MG chewable tablet Chew 1 tablet (200 mg of elemental calcium total) by mouth daily. 90 tablet 0   diclofenac Sodium (VOLTAREN) 1 % GEL Apply 4 g topically 4 (four) times daily. 4 g 0   gabapentin (NEURONTIN) 300 MG capsule Take 1 capsule (300 mg total) by mouth at bedtime. 90 capsule 0   Tiotropium Bromide Monohydrate (SPIRIVA RESPIMAT) 2.5 MCG/ACT AERS Inhale 2 puffs into the lungs daily. 1 each 2   No current facility-administered medications on file prior to visit.    Family History  Family history unknown: Yes    Social History   Socioeconomic History   Marital status: Unknown    Spouse name: Not on file   Number of children: Not on file   Years of education: Not on file   Highest education level: Not on file  Occupational History   Not on file  Tobacco Use   Smoking status: Former    Packs/day: 0.25    Years: 40.00    Pack years: 10.00    Types: Cigarettes   Smokeless tobacco: Never   Tobacco comments:    stopped years ago  Substance and Sexual Activity   Alcohol use: Not Currently   Drug use: Never   Sexual activity: Not on file  Other Topics Concern   Not on file  Social History Narrative    Not on file   Social Determinants of Health   Financial Resource Strain: Not on file  Food Insecurity: Not on file  Transportation Needs: Not on file  Physical Activity: Not on file  Stress: Not on file  Social Connections: Not on file  Intimate Partner Violence: Not on file    Review of Systems: ROS negative except for what is noted on the assessment and plan.  Vitals:   03/21/21 1035  BP: 125/77  Pulse: 91  Resp: (!) 28  Temp: 97.7 F (36.5 C)  TempSrc: Oral  SpO2: 97%  Weight: 129 lb 12.8 oz (58.9 kg)  Height: 5\' 5"  (1.651 m)     Physical Exam: Constitutional: well-appearing elderly woman sitting in the chair, in no acute distress HENT: normocephalic atraumatic, mucous membranes moist Eyes: conjunctiva non-erythematous Neck: supple Cardiovascular: regular rate and rhythm, no m/r/g Pulmonary/Chest: normal work of breathing on room air, lungs clear to auscultation bilaterally Abdominal: soft, non-tender, non-distended MSK: normal bulk and tone Neurological: alert & oriented x 3, 5/5 strength in bilateral upper and lower extremities, normal gait Skin: warm and dry Psych: normal mood   Assessment & Plan:   See Encounters Tab for problem based charting.  Patient discussed with Dr. , M.D. [place]  Internal Medicine, PGY-1 Pager: [phone], Phone: [phone] Date 03/21/2021 Time 4:10 PM

## 2021-03-21 NOTE — Assessment & Plan Note (Signed)
Patient expressed needing transportation to and from clinic appointments.    PLAN: Referral to social work

## 2021-03-21 NOTE — Progress Notes (Signed)
Spoke with patient via intepreter regarding request for inhaler education and problems from appt on 02/07/21 with Dr. Karilyn Cota. Patient reports she has not had any issues with inhalers since and patient demonstrated proper technique. No concerns remaining at this time

## 2021-03-22 ENCOUNTER — Other Ambulatory Visit: Payer: Self-pay | Admitting: Internal Medicine

## 2021-03-22 DIAGNOSIS — J449 Chronic obstructive pulmonary disease, unspecified: Secondary | ICD-10-CM

## 2021-03-22 DIAGNOSIS — R0609 Other forms of dyspnea: Secondary | ICD-10-CM

## 2021-03-22 NOTE — Addendum Note (Signed)
Addended by: Dellis Filbert I on: 03/22/2021 08:39 AM   Modules accepted: Orders

## 2021-03-28 ENCOUNTER — Telehealth: Payer: Self-pay

## 2021-03-28 NOTE — Telephone Encounter (Signed)
   Telephone encounter was:  Unsuccessful.  03/28/2021 Name: Alyssa Hammond MRN: [id number] DOB: [birth year]  Unsuccessful outbound call made today to assist with:  Called both home and mobile numbers with assitance from [facility] [id number].  There was no answer at either number and unable to leave a message regarding transportation.  Outreach Attempt:  1st Attempt   Jakobie Henslee, AAS Paralegal, [facility] Care Guide  Embedded Care Coordination [place]  Care Management  [address] [place], [place] [address] ??[email or web address]  ?? [phone]   [email or web address]

## 2021-03-29 NOTE — Congregational Nurse Program (Signed)
  Dept: [phone]   Congregational Nurse Program Note  Date of Encounter: 03/29/2021  Past Medical History: Past Medical History:  Diagnosis Date   Cataract    Chronic obstructive pulmonary disease (HCC) 07/26/2020   Dyspnea on exertion 05/13/2020   Hypertension     Encounter Details: Patient brought her new spiriva and unable to put it together. Patient assisted to assemble spiriva and re educated on how to self administer.Patient was able to self administer 2 puffs.  [name] Brixton Schnapp RN BSn PCCN  [facility] [phone]-cell [phone]-office

## 2021-04-05 NOTE — Congregational Nurse Program (Signed)
  Dept: [phone]   Congregational Nurse Program Note  Date of Encounter: 04/05/2021  Past Medical History: Past Medical History:  Diagnosis Date   Cataract    Chronic obstructive pulmonary disease (HCC) 07/26/2020   Dyspnea on exertion 05/13/2020   Hypertension     Encounter Details:   Patient is requesting assistance refilling Amlodipine. [facility] pharmacy contacted and medication refill completed.  [name] Buel Molder RN BSn PCCN  [facility] [phone]-cell [phone]-office

## 2021-04-13 ENCOUNTER — Telehealth: Payer: Self-pay

## 2021-04-13 NOTE — Telephone Encounter (Signed)
   Telephone encounter was:  Successful.  04/13/2021 Name: Alyssa Hammond MRN: [id number] DOB: [birth year]  Alyssa Hammond is a 80 y.o. year old female who is a primary care patient of Dellis Filbert, MD . The community resource team was consulted for assistance with Transportation Needs   Care guide performed the following interventions: Spoke with patient's son with assistance from [facility] ID [id number] about Medicaid Transportation for his mother.  He is aware that he needs to contact [facility] transportation-[phone] 3 days in advance of appointments.  Follow Up Plan:  No further follow up planned at this time. The patient has been provided with needed resources.  Noele Icenhour, AAS Paralegal, [facility] Care Guide  Embedded Care Coordination [place]  Care Management  [address] [place], [place] [address] ??[email or web address]  ?? [phone]   [email or web address]

## 2021-04-17 NOTE — Progress Notes (Signed)
Internal Medicine Clinic Attending  I saw and evaluated the patient.  I personally confirmed the key portions of the history and exam documented by Dr. Ariwodo and I reviewed pertinent patient test results.  The assessment, diagnosis, and plan were formulated together and I agree with the documentation in the resident's note.   

## 2021-04-25 ENCOUNTER — Telehealth: Payer: Self-pay

## 2021-04-25 NOTE — Telephone Encounter (Signed)
Patient son called requesting transportation assistance for the patient. Patient has appointment at [facility] outpatient rehab.Ride scheduled with [employer] for December 1st with pick up time of 08:30 am.   [name] Rayshawn Visconti RN BSn PCCN  [facility] [phone]-cell [phone]-office

## 2021-04-28 ENCOUNTER — Ambulatory Visit: Payer: Medicaid Other | Attending: Internal Medicine | Admitting: Physical Therapy

## 2021-04-28 ENCOUNTER — Encounter: Payer: Self-pay | Admitting: Physical Therapy

## 2021-04-28 ENCOUNTER — Other Ambulatory Visit: Payer: Self-pay

## 2021-04-28 DIAGNOSIS — G8929 Other chronic pain: Secondary | ICD-10-CM | POA: Insufficient documentation

## 2021-04-28 DIAGNOSIS — M5432 Sciatica, left side: Secondary | ICD-10-CM | POA: Diagnosis not present

## 2021-04-28 DIAGNOSIS — M79605 Pain in left leg: Secondary | ICD-10-CM | POA: Insufficient documentation

## 2021-04-28 DIAGNOSIS — M6281 Muscle weakness (generalized): Secondary | ICD-10-CM | POA: Insufficient documentation

## 2021-04-28 DIAGNOSIS — M545 Low back pain, unspecified: Secondary | ICD-10-CM | POA: Diagnosis not present

## 2021-04-28 NOTE — Patient Instructions (Signed)
Access Code: [email or web address] URL: [email or web address] Date: 04/28/2021 Prepared by: Rosana Hoes  Exercises Clam with Resistance - 2 x daily - 7 x weekly - 2 sets - 15 reps Supine Piriformis Stretch Pulling Heel to Hip - 2 x daily - 7 x weekly - 3 reps - 30 seconds hold Supine Lower Trunk Rotation - 2 x daily - 7 x weekly - 10 reps - 10 seconds hold Bridge - 2 x daily - 7 x weekly - 2 sets - 10 reps - 5 seconds hold

## 2021-04-28 NOTE — Therapy (Signed)
[facility] [address] [place], [place], [address] Phone: [phone]   Fax:  [phone]  Physical Therapy Evaluation  Patient Details  Name: Alyssa Hammond MRN: [id number] Date of Birth: 1940/10/01 Referring Provider (PT): Reymundo Poll, MD   Encounter Date: 04/28/2021   PT End of Session - 04/28/21 0937     Visit Number 1    Number of Visits 8    Date for PT Re-Evaluation 06/23/21    Authorization Type [employer]    PT Start Time 0915    PT Stop Time 1000    PT Time Calculation (min) 45 min    Activity Tolerance Patient tolerated treatment well    Behavior During Therapy [facility] for tasks assessed/performed             Past Medical History:  Diagnosis Date   Cataract    Chronic obstructive pulmonary disease (HCC) 07/26/2020   Dyspnea on exertion 05/13/2020   Hypertension     Past Surgical History:  Procedure Laterality Date   EYE SURGERY      There were no vitals filed for this visit.    Subjective Assessment - 04/28/21 0925     Subjective Patient reports sharp shooting pain from the left lower back into the left leg, she will also have numbness. She cannot stand 5 minutes on the left leg due to the nerve on her left leg. The pain stated on the right leg in June 2022, states she was sleeping and when she woke up the left leg was painful. She tried moving the leg to get rid of the pain but the pain would not go away. Now the pain has moved to the left side. She reports that the pain mainly comes on with standing and walking, and when she sits the pain will go away. Patient does not report a strength deficit in the left leg.    Patient is accompained by: Interpreter   in-person   Limitations Standing;Walking;House hold activities    How long can you stand comfortably? < 5 minutes    Patient Stated Goals Get pain relief and stand longer periods of time    Currently in Pain? Yes    Pain Score 5     Pain  Location Back    Pain Orientation Left    Pain Descriptors / Indicators Numbness;Sharp;Shooting    Pain Type Chronic pain    Pain Radiating Towards left leg down to ankle    Pain Onset More than a month ago    Pain Frequency Intermittent    Aggravating Factors  Standing, walking, working    Pain Relieving Factors Sitting, massage, has medication but it is not helpful                [facility] PT Assessment - 04/28/21 0001       Assessment   Medical Diagnosis Sciatica of left side    Referring Provider (PT) Reymundo Poll, MD    Onset Date/Surgical Date --   June 2022   Prior Therapy No      Precautions   Precautions None      Restrictions   Weight Bearing Restrictions No      Balance Screen   Has the patient fallen in the past 6 months No    Has the patient had a decrease in activity level because of a fear of falling?  No    Is the patient reluctant to leave their home because of  a fear of falling?  No      Home Environment   Living Environment Private residence    Living Arrangements Non-relatives/Friends    Home Access Level entry      Prior Function   Level of Independence Independent    Leisure None reported      Cognition   Overall Cognitive Status Within Functional Limits for tasks assessed      Observation/Other Assessments   Observations Patient appears in no apparent distress    Focus on Therapeutic Outcomes (FOTO)  NA - MCD      Sensation   Light Touch Impaired by gross assessment    Additional Comments Patient notes sensation deficit of left lateral/posterior lower leg      Coordination   Gross Motor Movements are Fluid and Coordinated Yes      ROM / Strength   AROM / PROM / Strength AROM;PROM;Strength      AROM   Overall AROM Comments Lumbar AROM grossly WFL in all directions, slight increase in left lower back/hip pain with left rotation      PROM   Overall PROM Comments Hip PROM grossly WFL and non painful      Strength   Overall  Strength Comments Strength grossly WFL except:    Strength Assessment Site Hip    Right/Left Hip Right;Left    Right Hip Extension 4-/5    Right Hip ABduction 4-/5    Left Hip Extension 4-/5   reproduction or left lower back / hip pain   Left Hip ABduction 4-/5      Flexibility   Soft Tissue Assessment /Muscle Length yes    Hamstrings WFL    Quadriceps WFL    Piriformis Slightly limited on left      Palpation   Palpation comment Tenderness and recreation of concordant left leg pain with palpation to left gluteal and piriformis region      Special Tests   Other special tests Lumbar radicular testing negative for reproduction of symptoms      Transfers   Transfers Independent with all Transfers                        Objective measurements completed on examination: See above findings.     [facility] Adult PT Treatment/Exercise:  Therapeutic Exercise: Clamshell with red x 15 Piriformis stretch x 30 sec LTR 2 x 10 sec Bridge 10 x 5 sec  Manual Therapy: N/A  Neuromuscular re-ed: N/A  Therapeutic Activity: N/A  Modalities: N/A  Self Care: N/A            PT Education - 04/28/21 1115     Education Details Exam findings, POC, HEP    Person(s) Educated Patient    Methods Explanation;Demonstration;Tactile cues;Verbal cues;Handout    Comprehension Verbalized understanding;Returned demonstration;Verbal cues required;Tactile cues required;Need further instruction              PT Short Term Goals - 04/28/21 1316       PT SHORT TERM GOAL #1   Title Patient will be I with initial HEP to progress with PT    Baseline provided HEP at eval    Time 4    Period Weeks    Status New    Target Date 05/26/21      PT SHORT TERM GOAL #2   Title Patient will be able to tolerate standing >/= 10 minutes to improve ability to perform household tasks    Baseline  unable to stand more that 5 minutes due to pain    Time 4    Period Weeks    Status New     Target Date 05/26/21      PT SHORT TERM GOAL #3   Title Patient will report pain level </= 3/10 in order to reduce functional limitations    Baseline 5/10 pain    Time 4    Period Weeks    Status New    Target Date 05/26/21               PT Long Term Goals - 04/28/21 1319       PT LONG TERM GOAL #1   Title Patient will be I with final HEP to maintain progress from PT    Baseline provided HEP at eval    Time 8    Period Weeks    Status New    Target Date 06/23/21      PT LONG TERM GOAL #2   Title Patient will demonstrate hip strength >/= 4/5 MMT in order to improve standing and walking tolerance    Baseline hip strength grossly 4-/5 MMT    Time 8    Period Weeks    Status New    Target Date 06/23/21      PT LONG TERM GOAL #3   Title Patient will report no limitation with standing >/= 20 minutes or performing household tasks    Baseline limited with standing 5 minutes and performing household tasks    Time 8    Period Weeks    Status New    Target Date 06/23/21      PT LONG TERM GOAL #4   Title Patient will report pain level </= 1/10 in order to improve acitvity level    Baseline 5/10 at eval    Time 8    Period Weeks    Status New    Target Date 06/23/21                    Plan - 04/28/21 1115     Clinical Impression Statement Patient presents to PT with left sided low back and leg radiating pain primarily when she is standing, and at a lesser level with walking and performing household tasks. She demonstrates an overall good range of motion and flexibility with slight limitation of piriformis on left, she does demonstrate gluteal weakness with recreation of her pain with left strength testing, tenderness and recreation of left leg pain with palpation to the left gluteal and piriformis region. He was provided exercises to initiate stretching and strengthening for the left hip, and she would benefit from continued skilled PT to progress her mobility and  strength in order to reduce pain and maximize functional ability with standing tasks.    Personal Factors and Comorbidities Age;Fitness;Time since onset of injury/illness/exacerbation    Examination-Activity Limitations Locomotion Level;Stand    Examination-Participation Restrictions Meal Prep;Cleaning;Community Activity;Shop    Stability/Clinical Decision Making Stable/Uncomplicated    Clinical Decision Making Low    Rehab Potential Good    PT Frequency 1x / week    PT Duration 8 weeks    PT Treatment/Interventions ADLs/Self Care Home Management;Aquatic Therapy;Cryotherapy;Electrical Stimulation;Iontophoresis 4mg /ml Dexamethasone;Moist Heat;Traction;Ultrasound;Neuromuscular re-education;Balance training;Therapeutic exercise;Therapeutic activities;Functional mobility training;Stair training;Gait training;Patient/family education;Manual techniques;Dry needling;Passive range of motion;Taping;Vasopneumatic Device;Spinal Manipulations;Joint Manipulations    PT Next Visit Plan Review HEP and progress PRN, manual/dry needling for left gluteal/piriformis region, progress core and hip stengthening  PT Home Exercise Plan [id number]    Consulted and Agree with Plan of Care Patient             Patient will benefit from skilled therapeutic intervention in order to improve the following deficits and impairments:  Decreased strength, Impaired flexibility, Pain, Decreased activity tolerance  Visit Diagnosis: Chronic left-sided low back pain, unspecified whether sciatica present  Pain in left leg  Muscle weakness (generalized)     Problem List Patient Active Problem List   Diagnosis Date Noted   Sciatica of left side 02/28/2021   GERD (gastroesophageal reflux disease) 02/28/2021   Liver masses 11/23/2020   Hypertension 09/25/2020   Chronic obstructive pulmonary disease (HCC) 07/26/2020   Rib pain on right side 07/23/2020   RUQ pain 05/13/2020   Hyperglycemia 05/13/2020   Healthcare  maintenance 05/13/2020    Rosana Hoes, PT, DPT, LAT, ATC 04/28/21  1:25 PM Phone: [phone] Fax: [phone]   [facility] [address] [place], [place], [address] Phone: [phone]   Fax:  [phone]  Name: Alyssa Hammond MRN: [id number] Date of Birth: 1941/01/14

## 2021-05-04 ENCOUNTER — Ambulatory Visit: Payer: Medicaid Other

## 2021-05-06 ENCOUNTER — Telehealth: Payer: Self-pay

## 2021-05-06 NOTE — Telephone Encounter (Signed)
Spoke with pt's daughter re: her mother's no show visit. Reminded her og the upcoming appt.

## 2021-05-11 ENCOUNTER — Ambulatory Visit: Payer: Medicaid Other

## 2021-05-11 ENCOUNTER — Other Ambulatory Visit: Payer: Self-pay

## 2021-05-11 DIAGNOSIS — M545 Low back pain, unspecified: Secondary | ICD-10-CM | POA: Diagnosis not present

## 2021-05-11 DIAGNOSIS — M79605 Pain in left leg: Secondary | ICD-10-CM

## 2021-05-11 DIAGNOSIS — G8929 Other chronic pain: Secondary | ICD-10-CM

## 2021-05-11 DIAGNOSIS — M6281 Muscle weakness (generalized): Secondary | ICD-10-CM

## 2021-05-11 NOTE — Therapy (Signed)
[facility] [address] [place], [place], [address] Phone: [phone]   Fax:  [phone]  Physical Therapy Treatment  Patient Details  Name: Alyssa Hammond MRN: [id number] Date of Birth: Sep 29, 1940 Referring Provider (PT): Reymundo Poll, MD   Encounter Date: 05/11/2021   PT End of Session - 05/11/21 1627     Visit Number 2    Number of Visits 8    Date for PT Re-Evaluation 06/23/21    Authorization Type [employer]    PT Start Time 0937    PT Stop Time 1022    PT Time Calculation (min) 45 min    Activity Tolerance Patient tolerated treatment well    Behavior During Therapy [facility] for tasks assessed/performed             Past Medical History:  Diagnosis Date   Cataract    Chronic obstructive pulmonary disease (HCC) 07/26/2020   Dyspnea on exertion 05/13/2020   Hypertension     Past Surgical History:  Procedure Laterality Date   EYE SURGERY      There were no vitals filed for this visit.   Subjective Assessment - 05/11/21 0946     Subjective Pt reports she is still having L low back pain with down her L LE to her L heel which has not improved. Pt rates her pain at 8/10 normally, but it is 5/10 in sitting.    Limitations Standing;Walking;House hold activities    How long can you stand comfortably? < 5 minutes    Patient Stated Goals Get pain relief and stand longer periods of time    Currently in Pain? Yes    Pain Score 8     Pain Location Back    Pain Orientation Left    Pain Descriptors / Indicators Numbness;Sharp;Shooting    Pain Type Chronic pain    Pain Radiating Towards left leg down to ankle    Pain Onset More than a month ago    Pain Frequency Intermittent    Aggravating Factors  Standing, walking, working    Pain Relieving Factors Sitting, massage, has medication but it is not helpful                    [facility] Adult PT Treatment/Exercise:   Therapeutic Exercise: - SKTC 2x  15", L and R - PPT 10x, 3" Clamshell with red x15 Piriformis stretch 2x 30 sec LTR 4 x 10 sec Bridge 10 x 5 sec. Pt reported and increase in L LBP. This ex was placed on hold, and will be reassessed the next visit session.   Manual Therapy: N/A   Neuromuscular re-ed: N/A   Therapeutic Activity: N/A   Modalities: N/A                        PT Education - 05/11/21 1627     Education Details Updated HEP    Person(s) Educated Patient    Methods Explanation;Demonstration;Tactile cues;Verbal cues;Handout    Comprehension Verbalized understanding;Returned demonstration;Verbal cues required;Tactile cues required;Need further instruction              PT Short Term Goals - 04/28/21 1316       PT SHORT TERM GOAL #1   Title Patient will be I with initial HEP to progress with PT    Baseline provided HEP at eval    Time 4    Period Weeks    Status New  Target Date 05/26/21      PT SHORT TERM GOAL #2   Title Patient will be able to tolerate standing >/= 10 minutes to improve ability to perform household tasks    Baseline unable to stand more that 5 minutes due to pain    Time 4    Period Weeks    Status New    Target Date 05/26/21      PT SHORT TERM GOAL #3   Title Patient will report pain level </= 3/10 in order to reduce functional limitations    Baseline 5/10 pain    Time 4    Period Weeks    Status New    Target Date 05/26/21               PT Long Term Goals - 04/28/21 1319       PT LONG TERM GOAL #1   Title Patient will be I with final HEP to maintain progress from PT    Baseline provided HEP at eval    Time 8    Period Weeks    Status New    Target Date 06/23/21      PT LONG TERM GOAL #2   Title Patient will demonstrate hip strength >/= 4/5 MMT in order to improve standing and walking tolerance    Baseline hip strength grossly 4-/5 MMT    Time 8    Period Weeks    Status New    Target Date 06/23/21      PT LONG TERM  GOAL #3   Title Patient will report no limitation with standing >/= 20 minutes or performing household tasks    Baseline limited with standing 5 minutes and performing household tasks    Time 8    Period Weeks    Status New    Target Date 06/23/21      PT LONG TERM GOAL #4   Title Patient will report pain level </= 1/10 in order to improve acitvity level    Baseline 5/10 at eval    Time 8    Period Weeks    Status New    Target Date 06/23/21                   Plan - 05/11/21 1628     Clinical Impression Statement PT was completd for lumbopelvic strengthening and flexibility. Pt needed much verbal and tactile cueing to complete her exs proprerly. Exs were modified and completed to pt's tolerance. Pt's HEP was provided. Following the PT session, pt reports her low back and L LE felt better. Pt tolerated today's PT sesssion without adverse effects. Pt will continue to benefit from skilled PT for improved function with decreased pain. .    Personal Factors and Comorbidities Age;Fitness;Time since onset of injury/illness/exacerbation    Examination-Activity Limitations Locomotion Level;Stand    Examination-Participation Restrictions Meal Prep;Cleaning;Community Activity;Shop    Stability/Clinical Decision Making Stable/Uncomplicated    Clinical Decision Making Low    Rehab Potential Good    PT Frequency 1x / week    PT Duration 8 weeks    PT Treatment/Interventions ADLs/Self Care Home Management;Aquatic Therapy;Cryotherapy;Electrical Stimulation;Iontophoresis 4mg /ml Dexamethasone;Moist Heat;Traction;Ultrasound;Neuromuscular re-education;Balance training;Therapeutic exercise;Therapeutic activities;Functional mobility training;Stair training;Gait training;Patient/family education;Manual techniques;Dry needling;Passive range of motion;Taping;Vasopneumatic Device;Spinal Manipulations;Joint Manipulations    PT Next Visit Plan Review HEP and progress PRN, manual/dry needling for left  gluteal/piriformis region, progress core and hip stengthening    PT Home Exercise Plan    Consulted and  Agree with Plan of Care Patient             Patient will benefit from skilled therapeutic intervention in order to improve the following deficits and impairments:  Decreased strength, Impaired flexibility, Pain, Decreased activity tolerance  Visit Diagnosis: Chronic left-sided low back pain, unspecified whether sciatica present  Pain in left leg  Muscle weakness (generalized)     Problem List Patient Active Problem List   Diagnosis Date Noted   Sciatica of left side 02/28/2021   GERD (gastroesophageal reflux disease) 02/28/2021   Liver masses 11/23/2020   Hypertension 09/25/2020   Chronic obstructive pulmonary disease (HCC) 07/26/2020   Rib pain on right side 07/23/2020   RUQ pain 05/13/2020   Hyperglycemia 05/13/2020   Healthcare maintenance 05/13/2020    Joellyn Rued, PT 05/11/2021, 4:48 PM  [facility] [place] [facility] [address] [place], [place], [address] Phone: [phone]   Fax:  [phone]  Name: Alyssa Hammond MRN: [id number] Date of Birth: 12/07/1940

## 2021-05-18 ENCOUNTER — Other Ambulatory Visit: Payer: Self-pay

## 2021-05-18 ENCOUNTER — Ambulatory Visit: Payer: Medicaid Other

## 2021-05-18 DIAGNOSIS — M6281 Muscle weakness (generalized): Secondary | ICD-10-CM

## 2021-05-18 DIAGNOSIS — M79605 Pain in left leg: Secondary | ICD-10-CM

## 2021-05-18 DIAGNOSIS — M545 Low back pain, unspecified: Secondary | ICD-10-CM

## 2021-05-18 NOTE — Therapy (Signed)
[facility] [address] [place], [place], [address] Phone: [phone]   Fax:  [phone]  Physical Therapy Treatment  Patient Details  Name: Alyssa Hammond MRN: [id number] Date of Birth: 1940-06-21 Referring Provider (PT): Reymundo Poll, MD   Encounter Date: 05/18/2021   PT End of Session - 05/18/21 1036     Visit Number 3    Number of Visits 8    Date for PT Re-Evaluation 06/23/21    Authorization Type [employer]    PT Start Time 1017    PT Stop Time 1100    PT Time Calculation (min) 43 min    Activity Tolerance Patient tolerated treatment well    Behavior During Therapy [facility] for tasks assessed/performed             Past Medical History:  Diagnosis Date   Cataract    Chronic obstructive pulmonary disease (HCC) 07/26/2020   Dyspnea on exertion 05/13/2020   Hypertension     Past Surgical History:  Procedure Laterality Date   EYE SURGERY      There were no vitals filed for this visit.   Subjective Assessment - 05/18/21 1023     Subjective Pt reports she is doing "fine" today. She reports her low back is feeling much better. Pt notes the HEP is helping alot and she is having pain primarily with prolonged standing. Walking is not bothering her as much.    Limitations Standing;Walking;House hold activities    How long can you stand comfortably? < 5 minutes    Patient Stated Goals Get pain relief and stand longer periods of time    Currently in Pain? Yes    Pain Score 0-No pain   pain can increase to 7/10 c prolonged standing   Pain Location Back    Pain Orientation Left    Pain Descriptors / Indicators Sharp;Shooting;Numbness    Pain Type Chronic pain    Pain Radiating Towards intermittent    Pain Onset More than a month ago    Pain Frequency Intermittent    Aggravating Factors  Standing    Pain Relieving Factors Sitting, HEP                       [facility] Adult PT  Treatment/Exercise:   Therapeutic Exercise: SKTC 2x 30", L and R  PPT 10x, 3", hand slides up thighs for abdominal activation Clamshell with red 2x15 Piriformis stretch 2x 30 sec LTR 4 x 10 sec Bridge 10 x 5 sec. Pt reported and increase in L LBP even c minimal bridging. This ex was stopped.   Manual Therapy: N/A   Neuromuscular re-ed: N/A   Therapeutic Activity: N/A   Modalities: N/A                   PT Short Term Goals - 04/28/21 1316       PT SHORT TERM GOAL #1   Title Patient will be I with initial HEP to progress with PT    Baseline provided HEP at eval    Time 4    Period Weeks    Status New    Target Date 05/26/21      PT SHORT TERM GOAL #2   Title Patient will be able to tolerate standing >/= 10 minutes to improve ability to perform household tasks    Baseline unable to stand more that 5 minutes due to pain    Time 4  Period Weeks    Status New    Target Date 05/26/21      PT SHORT TERM GOAL #3   Title Patient will report pain level </= 3/10 in order to reduce functional limitations    Baseline 5/10 pain    Time 4    Period Weeks    Status New    Target Date 05/26/21               PT Long Term Goals - 04/28/21 1319       PT LONG TERM GOAL #1   Title Patient will be I with final HEP to maintain progress from PT    Baseline provided HEP at eval    Time 8    Period Weeks    Status New    Target Date 06/23/21      PT LONG TERM GOAL #2   Title Patient will demonstrate hip strength >/= 4/5 MMT in order to improve standing and walking tolerance    Baseline hip strength grossly 4-/5 MMT    Time 8    Period Weeks    Status New    Target Date 06/23/21      PT LONG TERM GOAL #3   Title Patient will report no limitation with standing >/= 20 minutes or performing household tasks    Baseline limited with standing 5 minutes and performing household tasks    Time 8    Period Weeks    Status New    Target Date 06/23/21       PT LONG TERM GOAL #4   Title Patient will report pain level </= 1/10 in order to improve acitvity level    Baseline 5/10 at eval    Time 8    Period Weeks    Status New    Target Date 06/23/21                   Plan - 05/18/21 1040     Clinical Impression Statement Pt is making appropriate progress in PT with report of improved pain and pt demonstrating improved quality of movement with sit to/from standing and with sit to/from supine on the mat table. Pt notes the exs are helping and she completes them appropriately. PT was completed for lumbopelvic flexibility, c flexion biased therex, and with strengthening. Pt is tolerating abdominal and hip abd strengthening exs, but bridging consistently causes pain. L hip ER is limited in ROM, but neither PROM for IR or ER provokes the pt's pain. Pt will continue to benefit from skilled PT to optimize function with less pain.    Personal Factors and Comorbidities Age;Fitness;Time since onset of injury/illness/exacerbation    Examination-Activity Limitations Locomotion Level;Stand    Examination-Participation Restrictions Meal Prep;Cleaning;Community Activity;Shop    Stability/Clinical Decision Making Stable/Uncomplicated    Clinical Decision Making Low    Rehab Potential Good    PT Frequency 1x / week    PT Duration 8 weeks    PT Treatment/Interventions ADLs/Self Care Home Management;Aquatic Therapy;Cryotherapy;Electrical Stimulation;Iontophoresis 4mg /ml Dexamethasone;Moist Heat;Traction;Ultrasound;Neuromuscular re-education;Balance training;Therapeutic exercise;Therapeutic activities;Functional mobility training;Stair training;Gait training;Patient/family education;Manual techniques;Dry needling;Passive range of motion;Taping;Vasopneumatic Device;Spinal Manipulations;Joint Manipulations    PT Next Visit Plan Review HEP and progress PRN, manual/dry needling for left gluteal/piriformis region, progress core and hip stengthening. Assess STGs.     PT Home Exercise Plan    Consulted and Agree with Plan of Care Patient             Patient will  benefit from skilled therapeutic intervention in order to improve the following deficits and impairments:  Decreased strength, Impaired flexibility, Pain, Decreased activity tolerance  Visit Diagnosis: Chronic left-sided low back pain, unspecified whether sciatica present  Pain in left leg  Muscle weakness (generalized)     Problem List Patient Active Problem List   Diagnosis Date Noted   Sciatica of left side 02/28/2021   GERD (gastroesophageal reflux disease) 02/28/2021   Liver masses 11/23/2020   Hypertension 09/25/2020   Chronic obstructive pulmonary disease (HCC) 07/26/2020   Rib pain on right side 07/23/2020   RUQ pain 05/13/2020   Hyperglycemia 05/13/2020   Healthcare maintenance 05/13/2020   Joellyn Rued MS, PT 05/18/21 2:55 PM   [facility] [address] [place], [place], [address] Phone: [phone]   Fax:  [phone]  Name: Shalona Harbour MRN: [id number] Date of Birth: April 04, 1941

## 2021-05-25 ENCOUNTER — Other Ambulatory Visit: Payer: Self-pay

## 2021-05-25 ENCOUNTER — Ambulatory Visit: Payer: Medicaid Other

## 2021-05-25 DIAGNOSIS — G8929 Other chronic pain: Secondary | ICD-10-CM

## 2021-05-25 DIAGNOSIS — M6281 Muscle weakness (generalized): Secondary | ICD-10-CM

## 2021-05-25 DIAGNOSIS — M79605 Pain in left leg: Secondary | ICD-10-CM

## 2021-05-25 DIAGNOSIS — M545 Low back pain, unspecified: Secondary | ICD-10-CM | POA: Diagnosis not present

## 2021-05-25 NOTE — Therapy (Signed)
[place], [place], [address] Phone: [phone]   Fax:  [phone]  Physical Therapy Treatment  Patient Details  Name: Alyssa Hammond MRN: [id number] Date of Birth: April 04, 1941 Referring Provider (PT): Velna Ochs, MD   Encounter Date: 05/25/2021   PT End of Session - 05/25/21 1023     Visit Number 4    Number of Visits 8    Date for PT Re-Evaluation 06/23/21    Authorization Type [employer]    PT Start Time 0940    PT Stop Time 1030    PT Time Calculation (min) 50 min    Activity Tolerance Patient tolerated treatment well    Behavior During Therapy [facility] for tasks assessed/performed             Past Medical History:  Diagnosis Date   Cataract    Chronic obstructive pulmonary disease ([place]) 07/26/2020   Dyspnea on exertion 05/13/2020   Hypertension     Past Surgical History:  Procedure Laterality Date   EYE SURGERY      There were no vitals filed for this visit.   Subjective Assessment - 05/25/21 0945     Subjective Pt reports she is overall doing better.  Still having pain in the L leg and pt reports whole leg numbness. Pt replates the most signififactor as being the very cold weather recently    Patient Stated Goals Get pain relief and stand longer periods of time    Pain Score 8     Pain Location Back    Pain Orientation Left    Pain Descriptors / Indicators Sharp;Shooting;Numbness    Pain Type Chronic pain    Pain Onset More than a month ago    Pain Frequency Intermittent    Aggravating Factors  Standing    Pain Relieving Factors Sitting, HEP                            [facility] Adult PT Treatment/Exercise:   Therapeutic Exercise: Nu step, 5 mins, L3, UE/LE SKTC 2x 30", L and R Piriformis stretch in sitting 2x 30 sec  PPT 15x, 3", hand slides up thighs for abdominal activation Clamshell with GTB 2x15, verbal cue for eccentric concontrol Hip add  sets, ball squeeze, x15, 5" STS 2x10   Manual Therapy: N/A   Neuromuscular re-ed: N/A   Therapeutic Activity: N/A   Modalities: N/A   Not completed today: LTR 4 x 10 sec Bridge 10 x 5 sec. Pt reported and increase in L LBP even c minimal bridging. This ex was stopped.              PT Short Term Goals - 05/25/21 1040       PT SHORT TERM GOAL #1   Title Patient will be I with initial HEP to progress with PT.    Baseline provided HEP at eval    Status Achieved    Target Date 05/25/21      PT SHORT TERM GOAL #2   Title Patient will be able to tolerate standing >/= 10 minutes to improve ability to perform household tasks    Baseline unable to stand more that 5 minutes due to pain    Period Weeks    Status On-going      PT SHORT TERM GOAL #3   Title Patient will report pain level </= 3/10 in order to reduce functional limitations. 05/25/21: 0-8/10  Baseline 5/10 pain    Period Weeks    Status On-going    Target Date 05/25/21               PT Long Term Goals - 04/28/21 1319       PT LONG TERM GOAL #1   Title Patient will be I with final HEP to maintain progress from PT    Baseline provided HEP at eval    Time 8    Period Weeks    Status New    Target Date 06/23/21      PT LONG TERM GOAL #2   Title Patient will demonstrate hip strength >/= 4/5 MMT in order to improve standing and walking tolerance    Baseline hip strength grossly 4-/5 MMT    Time 8    Period Weeks    Status New    Target Date 06/23/21      PT LONG TERM GOAL #3   Title Patient will report no limitation with standing >/= 20 minutes or performing household tasks    Baseline limited with standing 5 minutes and performing household tasks    Time 8    Period Weeks    Status New    Target Date 06/23/21      PT LONG TERM GOAL #4   Title Patient will report pain level </= 1/10 in order to improve acitvity level    Baseline 5/10 at eval    Time 8    Period Weeks    Status New     Target Date 06/23/21                   Plan - 05/25/21 1039     Clinical Impression Statement Pt returns to PT with an increase in pain. Pt notes her L low back and LE pain and LE numbness related to the recent occurance of very cold weather. Pt continues to report a preference for ther ex and positioning which promotes lumbar flexion vs. ext. PT continued lumbopelvic/LE flexibility and strengthening with a min. demand in strengthening increased. At the end of the session, the pt reported low back pain of 5/10 and no LE pain. Pt has met good for HEP, while standing tolerance and pain goals are ongoing.    Personal Factors and Comorbidities Age;Fitness;Time since onset of injury/illness/exacerbation    Examination-Activity Limitations Locomotion Level;Stand    Examination-Participation Restrictions Meal Prep;Cleaning;Community Activity;Shop    Stability/Clinical Decision Making Stable/Uncomplicated    Clinical Decision Making Low    Rehab Potential Good    PT Frequency 1x / week    PT Duration 8 weeks    PT Treatment/Interventions ADLs/Self Care Home Management;Aquatic Therapy;Cryotherapy;Electrical Stimulation;Iontophoresis 4mg /ml Dexamethasone;Moist Heat;Traction;Ultrasound;Neuromuscular re-education;Balance training;Therapeutic exercise;Therapeutic activities;Functional mobility training;Stair training;Gait training;Patient/family education;Manual techniques;Dry needling;Passive range of motion;Taping;Vasopneumatic Device;Spinal Manipulations;Joint Manipulations    PT Next Visit Plan Review HEP and progress PRN, manual/dry needling for left gluteal/piriformis region, progress core and hip stengthening. Assess STGs.    PT Home Exercise Plan [email or web address]    Consulted and Agree with Plan of Care Patient             Patient will benefit from skilled therapeutic intervention in order to improve the following deficits and impairments:  Decreased strength, Impaired flexibility, Pain,  Decreased activity tolerance  Visit Diagnosis: Chronic left-sided low back pain, unspecified whether sciatica present  Pain in left leg  Muscle weakness (generalized)     Problem List Patient Active Problem List   Diagnosis  Date Noted   Sciatica of left side 02/28/2021   GERD (gastroesophageal reflux disease) 02/28/2021   Liver masses 11/23/2020   Hypertension 09/25/2020   Chronic obstructive pulmonary disease ([place]) 07/26/2020   Rib pain on right side 07/23/2020   RUQ pain 05/13/2020   Hyperglycemia 05/13/2020   Healthcare maintenance 05/13/2020   Gar Ponto MS, PT 05/25/21 11:02 AM   [facility] [address] [place], [place], [address] Phone: [phone]   Fax:  [phone]  Name: Alyssa Hammond MRN: [id number] Date of Birth: Mar 08, 1941

## 2021-06-01 ENCOUNTER — Other Ambulatory Visit: Payer: Self-pay

## 2021-06-01 ENCOUNTER — Ambulatory Visit: Payer: Medicaid Other | Attending: Internal Medicine

## 2021-06-01 DIAGNOSIS — M545 Low back pain, unspecified: Secondary | ICD-10-CM | POA: Insufficient documentation

## 2021-06-01 DIAGNOSIS — M6281 Muscle weakness (generalized): Secondary | ICD-10-CM | POA: Diagnosis present

## 2021-06-01 DIAGNOSIS — G8929 Other chronic pain: Secondary | ICD-10-CM | POA: Diagnosis present

## 2021-06-01 DIAGNOSIS — M79605 Pain in left leg: Secondary | ICD-10-CM | POA: Insufficient documentation

## 2021-06-01 NOTE — Therapy (Signed)
[facility] [address] [place], [place], [address] Phone: [phone]   Fax:  [phone]  Physical Therapy Treatment  Patient Details  Name: Alyssa Hammond MRN: [id number] Date of Birth: 05/31/1940 Referring Provider (PT): Reymundo PollGuilloud, Carolyn, MD   Encounter Date: 06/01/2021   PT End of Session - 06/01/21 0901     Visit Number 5    Number of Visits 8    Date for PT Re-Evaluation 06/23/21    Authorization Type [employer]    PT Start Time 0851    PT Stop Time 0933    PT Time Calculation (min) 42 min    Activity Tolerance Patient tolerated treatment well    Behavior [facility] for tasks assessed/performed             Past Medical History:  Diagnosis Date   Cataract    Chronic obstructive pulmonary disease (H[facility]) 07/26/2020   Dyspnea on exertion 05/13/2020   Hypertension     Past Surgical History:  Procedure Laterality Date   EYE SURGERY      There were no vitals filed for this visit.   Subjective Assessment - 06/01/21 0857     Subjective Currently not having pain. Overall, her back and leg pain are improved.    Patient is accompained by: Interpreter    Pertinent History AMN aimable [phone]    Patient Stated Goals Get pain relief and stand longer periods of time    Pain Score 0-No pain   no to low level of back pain   Pain Location Back    Pain Orientation Left    Pain Descriptors / Indicators Aching    Pain Type Chronic pain    Pain Radiating Towards intermittent    Pain Onset More than a month ago    Pain Frequency Intermittent    Aggravating Factors  standing    Pain Relieving Factors sitting , HEP                                   [facility] Adult PT Treatment/Exercise:   Therapeutic Exercise: Nu step, 5 mins, L3, UE/LE Forward trunk flexion c physioball roll out SKTC 2x 30", L and R Piriformis stretch in sitting 2x 30 sec  PPT 15x, 3", hand slides up thighs for  abdominal activation Clamshell with GTB 2x15, verbal cue for eccentric concontrol Bridge 5 x 5 sec. Pt reported an increase in L LBP even c minimal bridging. This ex was stopped. Hip add sets, ball squeeze, x5, 5". Pt reported an increase in L LBP. This ex was stopped. STS 2x10   Manual Therapy: Manual traction for low back with BLE pull with knees flexed on physioball   Neuromuscular re-ed: N/A   Therapeutic Activity: N/A   Modalities: N/A   Not completed today: LTR 4 x 10 sec        PT Short Term Goals - 05/25/21 1040       PT SHORT TERM GOAL #1   Title Patient will be I with initial HEP to progress with PT.    Baseline provided HEP at eval    Status Achieved    Target Date 05/25/21      PT SHORT TERM GOAL #2   Title Patient will be able to tolerate standing >/= 10 minutes to improve ability to perform household tasks    Baseline unable to stand more that 5  minutes due to pain    Period Weeks    Status On-going      PT SHORT TERM GOAL #3   Title Patient will report pain level </= 3/10 in order to reduce functional limitations. 05/25/21: 0-8/10    Baseline 5/10 pain    Period Weeks    Status On-going    Target Date 05/25/21               PT Long Term Goals - 04/28/21 1319       PT LONG TERM GOAL #1   Title Patient will be I with final HEP to maintain progress from PT    Baseline provided HEP at eval    Time 8    Period Weeks    Status New    Target Date 06/23/21      PT LONG TERM GOAL #2   Title Patient will demonstrate hip strength >/= 4/5 MMT in order to improve standing and walking tolerance    Baseline hip strength grossly 4-/5 MMT    Time 8    Period Weeks    Status New    Target Date 06/23/21      PT LONG TERM GOAL #3   Title Patient will report no limitation with standing >/= 20 minutes or performing household tasks    Baseline limited with standing 5 minutes and performing household tasks    Time 8    Period Weeks    Status New     Target Date 06/23/21      PT LONG TERM GOAL #4   Title Patient will report pain level </= 1/10 in order to improve acitvity level    Baseline 5/10 at eval    Time 8    Period Weeks    Status New    Target Date 06/23/21                   Plan - 06/01/21 0950     Clinical Impression Statement Pt subjective report indicates continued improvement in her low back and L LE pain. Pt presented with no pain. Pt reported pain during completion of bridging and hip add sets which were DCed. Pt responds with decreased pain with manual lumbar traction and lumbopelvic flexion biased flexibility and neutral strengthening exs. Pt will continue to benefit from skilled PT to improve function with reduce pain.    Personal Factors and Comorbidities Age;Fitness;Time since onset of injury/illness/exacerbation    Examination-Activity Limitations Locomotion Level;Stand    Examination-Participation Restrictions Meal Prep;Cleaning;Community Activity;Shop    Stability/Clinical Decision Making Stable/Uncomplicated    Clinical Decision Making Low    Rehab Potential Good    PT Frequency 1x / week    PT Duration 8 weeks    PT Treatment/Interventions ADLs/Self Care Home Management;Aquatic Therapy;Cryotherapy;Electrical Stimulation;Iontophoresis 4mg /ml Dexamethasone;Moist Heat;Traction;Ultrasound;Neuromuscular re-education;Balance training;Therapeutic exercise;Therapeutic activities;Functional mobility training;Stair training;Gait training;Patient/family education;Manual techniques;Dry needling;Passive range of motion;Taping;Vasopneumatic Device;Spinal Manipulations;Joint Manipulations    PT Next Visit Plan Review HEP and progress PRN, manual/dry needling for left gluteal/piriformis region, progress core and hip stengthening. Assess goals. Progress ther ex for strengthening in standing in neutral back positioning.    PT Home Exercise Plan    Consulted and Agree with Plan of Care Patient              Patient will benefit from skilled therapeutic intervention in order to improve the following deficits and impairments:  Decreased strength, Impaired flexibility, Pain, Decreased activity tolerance  Visit Diagnosis: Chronic left-sided  low back pain, unspecified whether sciatica present  Pain in left leg  Muscle weakness (generalized)     Problem List Patient Active Problem List   Diagnosis Date Noted   Sciatica of left side 02/28/2021   GERD (gastroesophageal reflux disease) 02/28/2021   Liver masses 11/23/2020   Hypertension 09/25/2020   Chronic obstructive pulmonary disease (HCC) 07/26/2020   Rib pain on right side 07/23/2020   RUQ pain 05/13/2020   Hyperglycemia 05/13/2020   Healthcare maintenance 05/13/2020    Joellyn Rued MS, PT 06/01/21 9:59 AM   [facility] [address] [place], [place], [address] Phone: [phone]   Fax:  [phone]  Name: Alyssa Hammond MRN: [id number] Date of Birth: 14-Mar-1941

## 2021-06-08 ENCOUNTER — Ambulatory Visit: Payer: Medicaid Other

## 2021-06-08 ENCOUNTER — Other Ambulatory Visit: Payer: Self-pay

## 2021-06-08 DIAGNOSIS — M79605 Pain in left leg: Secondary | ICD-10-CM

## 2021-06-08 DIAGNOSIS — M545 Low back pain, unspecified: Secondary | ICD-10-CM | POA: Diagnosis not present

## 2021-06-08 DIAGNOSIS — M6281 Muscle weakness (generalized): Secondary | ICD-10-CM

## 2021-06-08 NOTE — Patient Instructions (Signed)
Access Code: [email or web address] URL: [email or web address] Date: 06/08/2021 Prepared by: Joellyn Rued  Exercises Hooklying Single Knee to Chest Stretch - 2 x daily - 7 x weekly - 1 sets - 3 reps - 30 hold Supine Piriformis Stretch Pulling Heel to Hip - 2 x daily - 7 x weekly - 1 reps - 30 seconds hold Supine Lower Trunk Rotation - 2 x daily - 7 x weekly - 3 reps - 10 seconds hold Hooklying Clamshell with Resistance - 2 x daily - 7 x weekly - 1 sets - 10 reps Supine Posterior Pelvic Tilt - 2 x daily - 7 x weekly - 1 sets - 10 reps Shoulder extension with resistance - Neutral - 2 x daily - 7 x weekly - 1 sets - 15 reps Standing Shoulder Row with Anchored Resistance - 2 x daily - 7 x weekly - 3 sets - 15 reps

## 2021-06-08 NOTE — Therapy (Signed)
[facility] [address] [place], [place], [address] Phone: [phone]   Fax:  [phone]  Physical Therapy Treatment  Patient Details  Name: Alyssa Hammond MRN: [id number] Date of Birth: [birth year] Referring Provider (PT): Reymundo Poll, MD   Encounter Date: 06/08/2021   PT End of Session - 06/08/21 0931     Visit Number 6    Number of Visits 8    Date for PT Re-Evaluation 06/23/21    Authorization Type [employer]    PT Start Time 0930    PT Stop Time 1012    PT Time Calculation (min) 42 min    Activity Tolerance Patient tolerated treatment well    Behavior During Therapy [facility] for tasks assessed/performed             Past Medical History:  Diagnosis Date   Cataract    Chronic obstructive pulmonary disease (HCC) 07/26/2020   Dyspnea on exertion 05/13/2020   Hypertension     Past Surgical History:  Procedure Laterality Date   EYE SURGERY      There were no vitals filed for this visit.   Subjective Assessment - 06/08/21 0943     Subjective Pt continues to report good improvement in her low back and L LE pain. Standing still gives her the most difficulty, but she is able to complete chores in her home where hse is more active and moving consistently.    Patient Stated Goals Get pain relief and stand longer periods of time    Currently in Pain? No/denies    Pain Location Back    Pain Onset More than a month ago    Pain Frequency Intermittent    Aggravating Factors  standing    Pain Relieving Factors sitting                                 [facility] Adult PT Treatment/Exercise:   Therapeutic Exercise: Nu step, 5 mins, L3, UE/LE Forward trunk flexion c physioball roll out SKTC 2x 30", L and R Piriformis stretch in sitting 2x 30 sec  PPT 15x, 3", hand slides up thighs for abdominal activation Clamshell with GTB 2x15, verbal cue for eccentric control. STS 2x10 Shoulder ext,  x15, GTB Shoulder row x15, GTB   Manual Therapy:   Neuromuscular re-ed:   Therapeutic Activity:   Modalities:            PT Education - 06/08/21 1143     Education Details Updated HEP, Instructed in a staggered stance for improved tolerance with standing.    Person(s) Educated Patient    Methods Explanation;Demonstration;Tactile cues;Verbal cues;Handout    Comprehension Verbalized understanding;Returned demonstration;Verbal cues required;Tactile cues required              PT Short Term Goals - 05/25/21 1040       PT SHORT TERM GOAL #1   Title Patient will be I with initial HEP to progress with PT.    Baseline provided HEP at eval    Status Achieved    Target Date 05/25/21      PT SHORT TERM GOAL #2   Title Patient will be able to tolerate standing >/= 10 minutes to improve ability to perform household tasks    Baseline unable to stand more that 5 minutes due to pain    Period Weeks    Status On-going      PT SHORT  TERM GOAL #3   Title Patient will report pain level </= 3/10 in order to reduce functional limitations. 05/25/21: 0-8/10    Baseline 5/10 pain    Period Weeks    Status On-going    Target Date 05/25/21               PT Long Term Goals - 04/28/21 1319       PT LONG TERM GOAL #1   Title Patient will be I with final HEP to maintain progress from PT    Baseline provided HEP at eval    Time 8    Period Weeks    Status New    Target Date 06/23/21      PT LONG TERM GOAL #2   Title Patient will demonstrate hip strength >/= 4/5 MMT in order to improve standing and walking tolerance    Baseline hip strength grossly 4-/5 MMT    Time 8    Period Weeks    Status New    Target Date 06/23/21      PT LONG TERM GOAL #3   Title Patient will report no limitation with standing >/= 20 minutes or performing household tasks    Baseline limited with standing 5 minutes and performing household tasks    Time 8    Period Weeks    Status New     Target Date 06/23/21      PT LONG TERM GOAL #4   Title Patient will report pain level </= 1/10 in order to improve acitvity level    Baseline 5/10 at eval    Time 8    Period Weeks    Status New    Target Date 06/23/21                   Plan - 06/08/21 0932     Clinical Impression Statement Pt completed therex for lumbar flexibility with flexion bias and for core strengthening. Recommended staggered stance to help standing tolerance,and posterior chain strengthening exs were completed and added to the pt's HEP. Back strengthening exs were completed in standing due to supine bridging consistently aggravating the pt's low back. Pt tolerated the PT session today without adverse effects. Pt's pain and function are improving with standing being her most limited activity.    Personal Factors and Comorbidities Fitness;Time since onset of injury/illness/exacerbation    Examination-Activity Limitations Locomotion Level;Stand    Examination-Participation Restrictions Meal Prep;Cleaning;Community Activity;Shop    Stability/Clinical Decision Making Stable/Uncomplicated    Clinical Decision Making Low    Rehab Potential Poor    PT Frequency 1x / week    PT Duration 8 weeks    PT Treatment/Interventions ADLs/Self Care Home Management;Aquatic Therapy;Cryotherapy;Electrical Stimulation;Iontophoresis 4mg /ml Dexamethasone;Moist Heat;Traction;Ultrasound;Neuromuscular re-education;Balance training;Therapeutic exercise;Therapeutic activities;Functional mobility training;Stair training;Gait training;Patient/family education;Manual techniques;Dry needling;Passive range of motion;Taping;Vasopneumatic Device;Spinal Manipulations;Joint Manipulations    PT Next Visit Plan Review HEP and progress PRN, manual/dry needling for left gluteal/piriformis region, progress core and hip stengthening. Assess goals. Progress ther ex for strengthening in standing in neutral back positioning.    PT Home Exercise Plan  [id number]    Consulted and Agree with Plan of Care Patient             Patient will benefit from skilled therapeutic intervention in order to improve the following deficits and impairments:  Decreased strength, Impaired flexibility, Pain, Decreased activity tolerance  Visit Diagnosis: Chronic left-sided low back pain, unspecified whether sciatica present  Pain in left leg  Muscle weakness (  generalized)     Problem List Patient Active Problem List   Diagnosis Date Noted   Sciatica of left side 02/28/2021   GERD (gastroesophageal reflux disease) 02/28/2021   Liver masses 11/23/2020   Hypertension 09/25/2020   Chronic obstructive pulmonary disease (HCC) 07/26/2020   Rib pain on right side 07/23/2020   RUQ pain 05/13/2020   Hyperglycemia 05/13/2020   Healthcare maintenance 05/13/2020    Joellyn Rued MS, PT 06/08/21 12:04 PM   [facility] [address] [place], [place], [address] Phone: [phone]   Fax:  [phone]  Name: Khyleigh Furney MRN: [id number] Date of Birth: 04/18/1941

## 2021-06-14 NOTE — Congregational Nurse Program (Signed)
°  Dept: [phone]   Congregational Nurse Program Note  Date of Encounter: 06/14/2021  Past Medical History: Past Medical History:  Diagnosis Date   Cataract    Chronic obstructive pulmonary disease (HCC) 07/26/2020   Dyspnea on exertion 05/13/2020   Hypertension     Encounter Details:   Patient came in for blood pressure check. She would like to make annual appointment with [facility].She says that her eye glasses are not working well for her. I will try to schedule appointment with [facility].  [name] Darus Hershman RN BSn PCCN  [facility] [phone]-cell [phone]-office

## 2021-06-15 ENCOUNTER — Other Ambulatory Visit: Payer: Self-pay

## 2021-06-15 ENCOUNTER — Ambulatory Visit: Payer: Medicaid Other

## 2021-06-15 ENCOUNTER — Telehealth: Payer: Self-pay

## 2021-06-15 DIAGNOSIS — R42 Dizziness and giddiness: Secondary | ICD-10-CM

## 2021-06-15 DIAGNOSIS — M79605 Pain in left leg: Secondary | ICD-10-CM

## 2021-06-15 DIAGNOSIS — M545 Low back pain, unspecified: Secondary | ICD-10-CM | POA: Diagnosis not present

## 2021-06-15 DIAGNOSIS — M6281 Muscle weakness (generalized): Secondary | ICD-10-CM

## 2021-06-15 LAB — GLUCOSE, POCT (MANUAL RESULT ENTRY): POC Glucose: 117 mg/dl — AB (ref 70–99)

## 2021-06-15 NOTE — Telephone Encounter (Signed)
PT arrived requesting assistance with scheduling annual eye appointment. Appointment scheduled at [facility] for 4/27 at 0930. PT informed of date, time and address.  [name] Edlyn Rosenburg RN BSn PCCN  [facility] [phone]-cell [phone]-office

## 2021-06-15 NOTE — Therapy (Signed)
[place], [place], [address] Phone: [phone]   Fax:  [phone]  Physical Therapy Treatment  Patient Details  Name: Alyssa Hammond MRN: [id number] Date of Birth: 1940-09-20 Referring Provider (PT): Velna Ochs, MD   Encounter Date: 06/15/2021   PT End of Session - 06/15/21 0847     Visit Number 7    Number of Visits 8    Date for PT Re-Evaluation 06/23/21    Authorization Type [employer]    PT Start Time 0846    PT Stop Time 0930    PT Time Calculation (min) 44 min    Activity Tolerance Patient tolerated treatment well    Behavior During Therapy [facility] for tasks assessed/performed             Past Medical History:  Diagnosis Date   Cataract    Chronic obstructive pulmonary disease ([place]) 07/26/2020   Dyspnea on exertion 05/13/2020   Hypertension     Past Surgical History:  Procedure Laterality Date   EYE SURGERY      There were no vitals filed for this visit.   Subjective Assessment - 06/15/21 0901     Subjective Pt reports her low back is feeling better than last week. Pt is plesed with her progress.    Patient is accompained by: Interpreter   Jackson Latino c [employer], [phone]   Limitations Standing;Walking;House hold activities    How long can you stand comfortably? 10 mins    Patient Stated Goals Get pain relief and stand longer periods of time    Currently in Pain? No/denies    Pain Score --   4/10 with standing   Pain Location Back    Pain Orientation Left    Pain Descriptors / Indicators Aching    Pain Type Chronic pain    Pain Onset More than a month ago    Pain Frequency Intermittent    Aggravating Factors  Standing    Pain Relieving Factors Sitting                                [facility] Adult PT Treatment/Exercise:   Therapeutic Exercise: Nu step, 5 mins, L3, UE/LE Forward trunk flexion c physioball roll out, x10, 10" SKTC 2x 30", L and  R Piriformis stretch in sitting 1x 60 sec  PPT 15x, 3", hand slides up thighs for abdominal activation Clamshell with GTB 2x15 STS 2x10 Shoulder ext, 2x15, GTB Shoulder row 2x15, GTB            PT Short Term Goals - 06/15/21 1224       PT SHORT TERM GOAL #2   Title Patient will be able to tolerate standing >/= 10 minutes to improve ability to perform household tasks    Status Achieved    Target Date 06/15/21      PT SHORT TERM GOAL #3   Title Patient will report pain level </= 3/10 in order to reduce functional limitations. 05/25/21: 0-8/10. 06/15/20: 0-4/10    Baseline 5/10 pain    Status Partially Met    Target Date 06/15/21               PT Long Term Goals - 04/28/21 1319       PT LONG TERM GOAL #1   Title Patient will be I with final HEP to maintain progress from PT    Baseline provided HEP at  eval    Time 8    Period Weeks    Status New    Target Date 06/23/21      PT LONG TERM GOAL #2   Title Patient will demonstrate hip strength >/= 4/5 MMT in order to improve standing and walking tolerance    Baseline hip strength grossly 4-/5 MMT    Time 8    Period Weeks    Status New    Target Date 06/23/21      PT LONG TERM GOAL #3   Title Patient will report no limitation with standing >/= 20 minutes or performing household tasks    Baseline limited with standing 5 minutes and performing household tasks    Time 8    Period Weeks    Status New    Target Date 06/23/21      PT LONG TERM GOAL #4   Title Patient will report pain level </= 1/10 in order to improve acitvity level    Baseline 5/10 at eval    Time 8    Period Weeks    Status New    Target Date 06/23/21                   Plan - 06/15/21 0919     Clinical Impression Statement PT was completed for lumbopelvic flexibility and strengthening. Pt continues to report improved pain overall. Prolonged standing continues to give the pt he most difficulty, but to less of a degree and she is  totolerating standing longer. With STS, pt demonmstrates a good quality of movement with pace and control. Pt has 1 more PT session next week. Will assess LTGs and establish a final HEP. If pt continues to demonstrate good improvement, anticipate DC.    Personal Factors and Comorbidities Fitness;Time since onset of injury/illness/exacerbation    Examination-Activity Limitations Locomotion Level;Stand    Examination-Participation Restrictions Meal Prep;Cleaning;Community Activity;Shop    Stability/Clinical Decision Making Stable/Uncomplicated    Clinical Decision Making Low    Rehab Potential Poor    PT Frequency 1x / week    PT Duration 8 weeks    PT Treatment/Interventions ADLs/Self Care Home Management;Aquatic Therapy;Cryotherapy;Electrical Stimulation;Iontophoresis 21m/ml Dexamethasone;Moist Heat;Traction;Ultrasound;Neuromuscular re-education;Balance training;Therapeutic exercise;Therapeutic activities;Functional mobility training;Stair training;Gait training;Patient/family education;Manual techniques;Dry needling;Passive range of motion;Taping;Vasopneumatic Device;Spinal Manipulations;Joint Manipulations    PT Next Visit Plan Review HEP and progress PRN, manual/dry needling for left gluteal/piriformis region, progress core and hip stengthening. Assess goals. Progress ther ex for strengthening in standing in neutral back positioning.    PT Home Exercise Plan [email or web address]   Consulted and Agree with Plan of Care Patient             Patient will benefit from skilled therapeutic intervention in order to improve the following deficits and impairments:  Decreased strength, Impaired flexibility, Pain, Decreased activity tolerance  Visit Diagnosis: Chronic left-sided low back pain, unspecified whether sciatica present  Pain in left leg  Muscle weakness (generalized)     Problem List Patient Active Problem List   Diagnosis Date Noted   Sciatica of left side 02/28/2021   GERD  (gastroesophageal reflux disease) 02/28/2021   Liver masses 11/23/2020   Hypertension 09/25/2020   Chronic obstructive pulmonary disease ([place] 07/26/2020   Rib pain on right side 07/23/2020   RUQ pain 05/13/2020   Hyperglycemia 05/13/2020   Healthcare maintenance 05/13/2020    AGar PontoMS, PT 06/15/21 9:48 AM   [place]  [address] Phone: [phone]   Fax:  [phone]  Name: Alyssa Hammond MRN: [id number] Date of Birth: 05-15-1941

## 2021-06-15 NOTE — Progress Notes (Signed)
Blood sugar within normal range. Patient advised to eat small frequent meals, and to stay hydrated.  [name] Haylea Schlichting RN BSn PCCN  [facility] [phone] [phone]-office

## 2021-06-21 NOTE — Congregational Nurse Program (Signed)
°  Dept: [phone]   Congregational Nurse Program Note  Date of Encounter: 06/21/2021  Past Medical History: Past Medical History:  Diagnosis Date   Cataract    Chronic obstructive pulmonary disease (HCC) 07/26/2020   Dyspnea on exertion 05/13/2020   Hypertension     Encounter Details:  CNP Questionnaire - 06/21/21 1127       Questionnaire   Do you give verbal consent to treat you today? Yes    Location Patient Served  [facility]    Visit Setting Church or Organization    Patient Status Refugee    Insurance [facility]    Insurance Referral N/A    Medication Have Medication Insecurities;Provided Medication Assistance    Medical Provider Yes    Screening Referrals N/A    Medical Referral [facility]    Medical Appointment Made N/A    Food N/A    Transportation N/A    Housing/Utilities N/A    Interpersonal Safety N/A    Intervention Navigate Healthcare System;Educate;Support;Counsel;Case Management    ED Visit Averted Yes    Life-Saving Intervention Made N/A            Patient had broken her glasses temple bar.  Existing [facility] appointment scheduled in April.  Provided the patient with a pair of reading glasses.  Genevieve Norlander, CRNA Congregational Nurse

## 2021-06-22 ENCOUNTER — Other Ambulatory Visit: Payer: Self-pay

## 2021-06-22 ENCOUNTER — Ambulatory Visit: Payer: Medicaid Other

## 2021-06-22 DIAGNOSIS — M79605 Pain in left leg: Secondary | ICD-10-CM

## 2021-06-22 DIAGNOSIS — M545 Low back pain, unspecified: Secondary | ICD-10-CM | POA: Diagnosis not present

## 2021-06-22 DIAGNOSIS — M6281 Muscle weakness (generalized): Secondary | ICD-10-CM

## 2021-06-22 NOTE — Therapy (Signed)
[place], [place], [address] Phone: [phone]   Fax:  [phone]  Physical Therapy Treatment/Discharge  Patient Details  Name: Alyssa Hammond MRN: [id number] Date of Birth: [birth year] Referring Provider (PT): Velna Ochs, MD   Encounter Date: 06/22/2021   PT End of Session - 06/22/21 1030     Visit Number 8    Number of Visits 8    Date for PT Re-Evaluation 06/23/21    Authorization Type [employer]    PT Start Time 1021    PT Stop Time 1101    PT Time Calculation (min) 40 min    Activity Tolerance Patient tolerated treatment well    Behavior During Therapy [facility] for tasks assessed/performed             Past Medical History:  Diagnosis Date   Cataract    Chronic obstructive pulmonary disease ([place]) 07/26/2020   Dyspnea on exertion 05/13/2020   Hypertension     Past Surgical History:  Procedure Laterality Date   EYE SURGERY      There were no vitals filed for this visit.   Subjective Assessment - 06/22/21 1027     Subjective Pt reports she has made good improvemnt. Standing continues to bother hr the most.    Pertinent History Alyssa Hammond    How long can you stand comfortably? 10 mins    Currently in Pain? No/denies    Pain Score 0-No pain    Pain Location Back    Pain Orientation Left    Pain Descriptors / Indicators Aching    Pain Onset More than a month ago    Pain Frequency Intermittent    Aggravating Factors  Standing    Pain Relieving Factors Sitting                               [facility] Adult PT Treatment/Exercise:   Therapeutic Exercise: Nu step, 5 mins, L3, UE/LE Forward trunk flexion c physioball roll out, x10, 10" LTR, x5, 5" SKTC 2x 30", L and R Piriformis stretch in sitting 1x 60 sec  PPT 15x, 3", hand slides up thighs for abdominal activation Clamshell with GTB 2x15 STS 2x10 Shoulder ext, 2x15, GTB Shoulder row 2x15,  GTB         PT Education - 06/22/21 1230     Education Details Final HEP update    Person(s) Educated Patient    Methods Explanation;Demonstration;Tactile cues;Verbal cues;Handout    Comprehension Verbalized understanding;Returned demonstration;Verbal cues required;Tactile cues required              PT Short Term Goals - 06/22/21 1049       PT SHORT TERM GOAL #1   Title Patient will be I with initial HEP to progress with PT.    Status Achieved    Target Date 05/25/21      PT SHORT TERM GOAL #2   Title Patient will be able to tolerate standing >/= 10 minutes to improve ability to perform household tasks    Baseline unable to stand more that 5 minutes due to pain    Status Achieved    Target Date 06/15/21      PT SHORT TERM GOAL #3   Title Patient will report pain level </= 3/10 in order to reduce functional limitations. 05/25/21: 0-8/10. 06/15/20: 0-4/10 06/22/21: 0-3/10    Baseline 5/10 pain    Status Achieved  Target Date 06/22/21               PT Long Term Goals - 06/22/21 1057       PT LONG TERM GOAL #1   Title Patient will be I with final HEP to maintain progress from PT    Status Achieved    Target Date 06/22/21      PT LONG TERM GOAL #2   Title Patient will demonstrate hip strength >/= 4/5 MMT in order to improve standing and walking tolerance    Baseline hip strength grossly 4-/5 MMT    Status Achieved    Target Date 06/22/21      PT LONG TERM GOAL #3   Title Patient will report no limitation with standing >/= 20 minutes or performing household tasks. 1/25//23: 10 min standing tolerance. Improved, partially met.    Baseline limited with standing 5 minutes and performing household tasks    Status Partially Met    Target Date 06/22/21      PT LONG TERM GOAL #4   Title Patient will report pain level </= 1/10 in order to improve acitvity level. 06/22/21: 0-3/10 Improved, partially met    Baseline 5/10 at eval    Status Partially Met                    Plan - 06/22/21 1044     Clinical Impression Statement Pt is DCed from PT making good progress re; pain and function. See PT goals below. Pt is pleased with her progress. Pt is Ind in a HEP to maintain achieved LOF. Pt is agreeable to DC at this time.    Personal Factors and Comorbidities Fitness;Time since onset of injury/illness/exacerbation    Examination-Activity Limitations Locomotion Level;Stand    Examination-Participation Restrictions Meal Prep;Cleaning;Community Activity;Shop    Stability/Clinical Decision Making Stable/Uncomplicated    Clinical Decision Making Low    Rehab Potential Poor    PT Frequency 1x / week    PT Duration 8 weeks    PT Treatment/Interventions ADLs/Self Care Home Management;Aquatic Therapy;Cryotherapy;Electrical Stimulation;Iontophoresis 34m/ml Dexamethasone;Moist Heat;Traction;Ultrasound;Neuromuscular re-education;Balance training;Therapeutic exercise;Therapeutic activities;Functional mobility training;Stair training;Gait training;Patient/family education;Manual techniques;Dry needling;Passive range of motion;Taping;Vasopneumatic Device;Spinal Manipulations;Joint Manipulations    PT Home Exercise Plan [email or web address]   Consulted and Agree with Plan of Care Patient             Patient will benefit from skilled therapeutic intervention in order to improve the following deficits and impairments:  Decreased strength, Impaired flexibility, Pain, Decreased activity tolerance  Visit Diagnosis: Chronic left-sided low back pain, unspecified whether sciatica present  Pain in left leg  Muscle weakness (generalized)     Problem List Patient Active Problem List   Diagnosis Date Noted   Sciatica of left side 02/28/2021   GERD (gastroesophageal reflux disease) 02/28/2021   Liver masses 11/23/2020   Hypertension 09/25/2020   Chronic obstructive pulmonary disease ([place] 07/26/2020   Rib pain on right side 07/23/2020   RUQ pain 05/13/2020    Hyperglycemia 05/13/2020   Healthcare maintenance 05/13/2020    PHYSICAL THERAPY DISCHARGE SUMMARY  Visits from Start of Care: 8  Current functional level related to goals / functional outcomes: See above   Remaining deficits: See above   Education / Equipment: HEP   Patient agrees to discharge. Patient goals were partially met. Patient is being discharged due to being pleased with the current functional level.   AGar PontoMS, PT 06/22/21 12:46 PM   [facility]  [place], [place], [address] Phone: [phone]   Fax:  [phone]  Name: Alyssa Hammond MRN: [id number] Date of Birth: 1940/10/18

## 2021-06-22 NOTE — Patient Instructions (Signed)
Access Code: [email or web address] URL: [email or web address] Date: 06/22/2021 Prepared by: Joellyn Rued  Exercises Hooklying Single Knee to Chest Stretch - 2 x daily - 7 x weekly - 1 sets - 3 reps - 30 hold Supine Piriformis Stretch Pulling Heel to Hip - 2 x daily - 7 x weekly - 1 reps - 30 seconds hold Supine Lower Trunk Rotation - 2 x daily - 7 x weekly - 3 reps - 10 seconds hold Hooklying Clamshell with Resistance - 2 x daily - 7 x weekly - 1 sets - 10 reps Supine Posterior Pelvic Tilt - 2 x daily - 7 x weekly - 1 sets - 10 reps Sit to Stand Without Arm Support - 2 x daily - 7 x weekly - 1 sets - 10 reps Shoulder extension with resistance - Neutral - 2 x daily - 7 x weekly - 1 sets - 15 reps Standing Shoulder Row with Anchored Resistance - 2 x daily - 7 x weekly - 3 sets - 15 reps

## 2021-07-08 LAB — GLUCOSE, POCT (MANUAL RESULT ENTRY): POC Glucose: 104 mg/dl — AB (ref 70–99)

## 2021-07-13 ENCOUNTER — Other Ambulatory Visit: Payer: Self-pay | Admitting: Pediatric Intensive Care

## 2021-07-13 DIAGNOSIS — Z139 Encounter for screening, unspecified: Secondary | ICD-10-CM

## 2021-07-20 NOTE — Congregational Nurse Program (Signed)
°  Dept: [phone]   Congregational Nurse Program Note  Date of Encounter: 07/20/2021  Past Medical History: Past Medical History:  Diagnosis Date   Cataract    Chronic obstructive pulmonary disease (HCC) 07/26/2020   Dyspnea on exertion 05/13/2020   Hypertension     Encounter Details:  CNP Questionnaire - 07/20/21 1120       Questionnaire   Do you give verbal consent to treat you today? Yes    Location Patient Served  [facility]    Visit Setting Church or Organization    Patient Status Refugee    Insurance [facility]    Insurance Referral N/A    Medication N/A    Medical Provider Yes    Screening Referrals N/A    Medical Referral N/A    Medical Appointment Made N/A           Patient came in requesting help with Medicaid forms and help was provided. I took her blood pressure which was normal. We also spoke about her prescription for gabapentin and called her pharmacy for a re-fill. Told the patient when to pick up the medication and when to take it.   [name] Octavion Mollenkopf RN BSn PCCN  [facility] [phone]-cell [phone]-office

## 2021-07-21 ENCOUNTER — Other Ambulatory Visit: Payer: Self-pay

## 2021-07-21 DIAGNOSIS — M5432 Sciatica, left side: Secondary | ICD-10-CM

## 2021-07-22 MED ORDER — GABAPENTIN 300 MG PO CAPS: 300.0000 mg | ORAL_CAPSULE | Freq: Every day | ORAL | 0 refills | Status: DC

## 2021-08-23 NOTE — Congregational Nurse Program (Signed)
?  Dept: [phone] ? ? ?Congregational Nurse Program Note ? ?Date of Encounter: 08/23/2021 ? ?Past Medical History: ?Past Medical History:  ?Diagnosis Date  ? Cataract   ? Chronic obstructive pulmonary disease (HCC) 07/26/2020  ? Dyspnea on exertion 05/13/2020  ? Hypertension   ? ? ?Encounter Details: ? CNP Questionnaire - 08/23/21 1126   ? ?  ? Questionnaire  ? Do you give verbal consent to treat you today? Yes   ? Location Patient Served  [facility]   ? Visit Setting Church or Organization   ? Patient Status Refugee   ? Insurance Medicaid   ? Insurance Referral N/A   ? Medication Provided Medication Assistance;Have Medication Insecurities   ? Medical Provider Yes   ? Screening Referrals N/A   ? Medical Referral N/A   ? Medical Appointment Made N/A   ? Food N/A   ? Transportation Need transportation assistance;Provided transportation assistance   ? Housing/Utilities N/A   ? Interpersonal Safety N/A   ? Intervention Blood pressure;Navigate Healthcare System;Case Management;Counsel;Support;Educate;Advocate   ? ED Visit Averted Yes   ? Life-Saving Intervention Made N/A   ? ?  ?  ? ?  ? ?Patient ran out of Amlodipine and requesting refills. Pharmacy contacted and medication will be available today for pick up. ? ?Arman Bogus RN BSn PCCN  ?[facility] ?[phone]-cell ?[phone]-office ? ? ? ? ?

## 2021-08-31 ENCOUNTER — Encounter: Payer: Self-pay | Admitting: Internal Medicine

## 2021-08-31 ENCOUNTER — Other Ambulatory Visit: Payer: Self-pay

## 2021-08-31 ENCOUNTER — Ambulatory Visit: Payer: Medicaid Other | Admitting: Internal Medicine

## 2021-08-31 DIAGNOSIS — M5432 Sciatica, left side: Secondary | ICD-10-CM | POA: Diagnosis present

## 2021-08-31 DIAGNOSIS — K219 Gastro-esophageal reflux disease without esophagitis: Secondary | ICD-10-CM

## 2021-08-31 DIAGNOSIS — I10 Essential (primary) hypertension: Secondary | ICD-10-CM

## 2021-08-31 MED ORDER — NAPROXEN 500 MG PO TABS: 500.0000 mg | ORAL_TABLET | Freq: Two times a day (BID) | ORAL | 0 refills | Status: DC

## 2021-08-31 MED ORDER — PANTOPRAZOLE SODIUM 40 MG PO TBEC: 40.0000 mg | DELAYED_RELEASE_TABLET | Freq: Every day | ORAL | 3 refills | Status: AC

## 2021-08-31 NOTE — Patient Instructions (Addendum)
Ms Alyssa Hammond, ? ?It was a pleasure seeing you in clinic. Today we discussed:  ? ?Low back pain/leg pain:  ?At this time, please take NAPROXEN 500MG  TWICE DAILY  ?Recommend HEAT THERAPY ?Continue STRETCHING EXERCISES  ?Follow up in 3-4 weeks ? ?If you have any questions or concerns, please call our clinic at [phone] between 9am-5pm and after hours call [phone] and ask for the internal medicine resident on call. If you feel you are having a medical emergency please call 911.  ? ?Thank you, we look forward to helping you remain healthy! ? ? ? ?

## 2021-08-31 NOTE — Progress Notes (Signed)
? ?  CC: lower back pain ? ?HPI: ? ?[name] is a 81 y.o. female with PMHx as stated below presenting for evaluation of lower back pain radiating to the left hip and leg that has been worse over the past two weeks. Please see problem based charting for complete assessment and plan.  ? ?Interpreter: [name], Engineer, water  ? ?Past Medical History:  ?Diagnosis Date  ? Cataract   ? Chronic obstructive pulmonary disease (HCC) 07/26/2020  ? Dyspnea on exertion 05/13/2020  ? Hypertension   ? ?Review of Systems:  Negative except as stated in HPI. ? ?Physical Exam: ? ?Vitals:  ? 08/31/21 1331  ?BP: 124/88  ?Pulse: 79  ?Temp: 98.2 ?F (36.8 ?C)  ?TempSrc: Oral  ?SpO2: 95%  ?Weight: 124 lb 11.2 oz (56.6 kg)  ? ?Physical Exam  ?Constitutional: Appears well-developed and well-nourished. No distress.  ?HENT: Normocephalic and atraumatic ?Cardiovascular: Normal rate, regular rhythm.Distal pulses intact ?Respiratory: Effort normal on room air  ?Musculoskeletal: Normal bulk and tone.  No peripheral edema noted.  ? Back: No gross deformity noted; Midline TTP at L4-5 extending to the left paraspinal muscles and along the buttocks region. Active and passive ROM intact. Strength 5/5 in bilat lower extremities and sensation to light touch intact. +straight leg raise ?Neurological: Is alert and oriented x4, no focal deficits noted.  ?Skin: Warm and dry.  No rash, erythema, lesions noted. ?Psychiatric: Normal mood and affect.  ? ?Assessment & Plan:  ? ?See Encounters Tab for problem based charting. ? ?Patient discussed with Dr.  Lafonda Mosses ? ?

## 2021-08-31 NOTE — Congregational Nurse Program (Signed)
?  Dept: [phone] ? ? ?Congregational Nurse Program Note ? ?Date of Encounter: 08/31/2021 ? ?Past Medical History: ?Past Medical History:  ?Diagnosis Date  ? Cataract   ? Chronic obstructive pulmonary disease (HCC) 07/26/2020  ? Dyspnea on exertion 05/13/2020  ? Hypertension   ? ? ?Encounter Details: ? CNP Questionnaire - 08/31/21 0953   ? ?  ? Questionnaire  ? Do you give verbal consent to treat you today? Yes   ? Location Patient Served  [facility]   ? Visit Setting Church or Organization   ? Patient Status Refugee   ? Insurance Medicaid   ? Insurance Referral N/A   ? Medication Provided Medication Assistance;Have Medication Insecurities   ? Medical Provider Yes   ? Screening Referrals N/A   ? Medical Referral N/A   ? Medical Appointment Made [facility]CP[facility]   ? Food N/A   ? Transportation Need transportation assistance;Provided transportation assistance   ? Housing/Utilities N/A   ? Interpersonal Safety N/A   ? Intervention Blood pressure;Navigate Healthcare System;Case Management;Counsel;Support;Educate;Advocate   ? ED Visit Averted Yes   ? Life-Saving Intervention Made N/A   ? ?  ?  ? ?  ? ?Patient is complaining of left hip pain radiating to left leg. She has tried over the counter pain medication without relief. Appointment scheduled with PCP for today after noon. ? ?Arman Bogus RN BSn PCCN  ?[facility] ?[phone]-cell ?[phone]-office ? ? ? ?

## 2021-09-01 NOTE — Assessment & Plan Note (Addendum)
Patient is presenting with two weeks of acute left sided back pain radiating to her left leg, described as "knawing/chewing". She denies any recent trauma and attributes this to the cold weather. She reports that the pain has progressively worsened and has trouble standing or walking. She has tried tylenol 1g twice daily for the past 3 days ?Tried tylenol 500mg  x6 doses (1000mg  morning and evening) without significant relief. On examination, patient has tenderness to palpation over the L4-5 midline with extension along the left paraspinal muscles and down the left buttocks extending along the lateral aspect of thigh, down to the foot. Straight leg positive. She does have full active and passive ROM in the left thigh.  ?She had similar episode of this in October for which she was prescribed gabapentin and recommended for physical therapy. She reports that the gabapentin didn't seem to help much. She has been going to physical therapy and participating in yoga.  ?Her exam findings are most consistent with ongoing sciatic nerve pain.  ? ?Plan: ?Trial of naproxen 500mg  twice daily x14 days ?Recommended to continue ROM exercises and yoga ?Recommended for thermotherapy ?If no improvement, can consider imaging of lower spine and possibly changing to lyrica vs SSRI ?

## 2021-09-01 NOTE — Assessment & Plan Note (Signed)
Patient is requesting something to help with reflux since she will be taking NSAID. ? ?Plan: ?Protonix 40mg  daily prescribed  ?

## 2021-09-01 NOTE — Assessment & Plan Note (Signed)
BP Readings from Last 3 Encounters:  ?08/31/21 124/88  ?08/31/21 111/71  ?08/23/21 120/79  ? ?BP remains at goal on amlodipine 5mg  daily. Denies any side effects at this time.  ? ?Plan: ?Continue current regimen.  ?

## 2021-09-02 NOTE — Progress Notes (Signed)
Internal Medicine Clinic Attending ° °Case discussed with Dr. Aslam  At the time of the visit.  We reviewed the resident’s history and exam and pertinent patient test results.  I agree with the assessment, diagnosis, and plan of care documented in the resident’s note.  °

## 2021-09-12 ENCOUNTER — Other Ambulatory Visit: Payer: Self-pay

## 2021-09-13 MED ORDER — NAPROXEN 500 MG PO TABS: 500.0000 mg | ORAL_TABLET | Freq: Two times a day (BID) | ORAL | 0 refills | Status: AC

## 2021-09-24 ENCOUNTER — Other Ambulatory Visit: Payer: Self-pay | Admitting: Internal Medicine

## 2021-10-02 ENCOUNTER — Telehealth: Payer: Self-pay

## 2021-10-02 NOTE — Telephone Encounter (Signed)
Contacted patient son with appointment reminder. ? ?Arman Bogus RN BSN PCCN  ?[facility] ?[phone]-cell ?[phone]-office ? ?

## 2021-10-03 ENCOUNTER — Ambulatory Visit: Payer: Medicaid Other | Admitting: Internal Medicine

## 2021-10-03 DIAGNOSIS — K219 Gastro-esophageal reflux disease without esophagitis: Secondary | ICD-10-CM

## 2021-10-03 DIAGNOSIS — M5432 Sciatica, left side: Secondary | ICD-10-CM

## 2021-10-03 DIAGNOSIS — R631 Polydipsia: Secondary | ICD-10-CM

## 2021-10-03 DIAGNOSIS — Z87891 Personal history of nicotine dependence: Secondary | ICD-10-CM

## 2021-10-03 DIAGNOSIS — Z0000 Encounter for general adult medical examination without abnormal findings: Secondary | ICD-10-CM

## 2021-10-03 DIAGNOSIS — I10 Essential (primary) hypertension: Secondary | ICD-10-CM | POA: Diagnosis present

## 2021-10-03 LAB — POCT GLYCOSYLATED HEMOGLOBIN (HGB A1C): Hemoglobin A1C: 5.8 % — AB (ref 4.0–5.6)

## 2021-10-03 LAB — GLUCOSE, CAPILLARY: Glucose-Capillary: 102 mg/dL — ABNORMAL HIGH (ref 70–99)

## 2021-10-03 NOTE — Assessment & Plan Note (Signed)
Of recent, she has been complaining of dry mouth and increased thirst.  Her diet consist of rice and other carbohydrates.  Back in 2021 she had a history of hyperglycemia, A1c was normal at that time at 5.6%.  She is concerned about diabetes and given her chronic history of nerve pain, will obtain A1c today. ? ? ?P: ?-A1c pending ?

## 2021-10-03 NOTE — Patient Instructions (Signed)
Thank you, [name] for allowing [place] to provide your care today. Today we discussed leg pain.   ? ?I have ordered the following labs for you: ? ?Lab Orders    ?     POC Hbg A1C     ? ?Tests ordered today: ? ?MRI of lumbar (low back) ? ?Referrals ordered today:  ? ?Referral Orders  ?No referral(s) requested today  ?  ? ? ? ?Follow up: 3 months  ? ?Remember: Continue taking medications as directed. Please obtain imaging of lower back ? ?Should you have any questions or concerns please call the internal medicine clinic at [phone].   ? ?Dellis Filbert, MD ?[facility] ?  ?

## 2021-10-03 NOTE — Assessment & Plan Note (Addendum)
Patient has chronic sciatica of the left leg.  She has tried Tylenol, ibuprofen and Lyrica. She has also tried Cymbalta with minimal relief.  She has been referred to physical therapy and recommended stretching and thermal therapy.  She reports temporary relieve of symptoms. Physical therapy was referred in the past, she attended a few sessions and the pain resolved. However, a month later the pain reoccurred. The pain, starts at the left posterior thigh and radiates down the left. Described as stabbing pain with associated numbness. This sensation occurs when standing and ambulating. Alleviate when sitting down and bending forward. Denies any recent falls. She able to ambulate without assistance. Denies incontinence of bowel/bladder. No personal history of cancer. No fever, chills or unintentional weight loss. On exam, straight leg positive and flexion of spine provided relief. Will obtain MRI of lumbar spine, she may have stenosing of the foramina causing nerve impingement. She may not be a candidate for surgery however, steroid injections may help. In the meantime, she was encouraged to continue stretching. She is not interested in physical therapy at this time.  P: -Follow up MRI lumbar spine  Addendum: [employer] medicaid peer to peer call for authorization of MRI lumbar spine.  Reference #[id number].  Indication for MRI lumbar spine for patient's sciatica pain that has persisted despite multiple care management. She has tried OTC NSAIDS, thermotherapy and first line SSRI/SNRI with minimal relieve. Patient participated in physical therapy from 05/11/2021 to 06/22/2021 for a total of 8 sessions.  Please see physical therapy discharge note on June 22, 2021. Patient was seen by [facility] health outpatient rehabilitation center in [facility]. She has tried at home exercises as well with minimal improvement. All conservative and proactive managements have been unsuccessful therefore MRI of lumbar  spine is warranted at this time.

## 2021-10-03 NOTE — Assessment & Plan Note (Signed)
Son present at today's visit.  Chaplain was consulted to the office for advanced directives completion ?

## 2021-10-03 NOTE — Assessment & Plan Note (Signed)
Home medication includes amlodipine 5 mg daily.  Blood pressure well controlled on this regiment. ?

## 2021-10-03 NOTE — Progress Notes (Signed)
? ?CC: Leg pain, diabetes check, advanced directive ? ?HPI: ? ?[name] is a 81 y.o. female with a past medical history stated below and presents today for CC listed above. Please see problem based assessment and plan for additional details. ? ?Past Medical History:  ?Diagnosis Date  ? Cataract   ? Chronic obstructive pulmonary disease ([place]) 07/26/2020  ? Dyspnea on exertion 05/13/2020  ? Hypertension   ? ? ?Current Outpatient Medications on File Prior to Visit  ?Medication Sig Dispense Refill  ? albuterol (VENTOLIN HFA) 108 (90 Base) MCG/ACT inhaler INHALE 2 PUFFS BY MOUTH INTO THE LUNGS EVERY 6 HOURS AS NEEDED FOR WHEEZING AND SHORTNESS OF BREATH 8.5 g 2  ? amLODipine (NORVASC) 5 MG tablet Take 1 tablet (5 mg total) by mouth daily. 30 tablet 11  ? diclofenac Sodium (VOLTAREN) 1 % GEL Apply 4 g topically 4 (four) times daily. 4 g 0  ? gabapentin (NEURONTIN) 300 MG capsule Take 1 capsule (300 mg total) by mouth at bedtime. 90 capsule 0  ? pantoprazole (PROTONIX) 40 MG tablet Take 1 tablet (40 mg total) by mouth daily. 90 tablet 3  ? Tiotropium Bromide Monohydrate (SPIRIVA RESPIMAT) 2.5 MCG/ACT AERS Inhale 2 puffs into the lungs daily. 1 each 2  ? ?No current facility-administered medications on file prior to visit.  ? ? ?Family History  ?Family history unknown: Yes  ? ? ?Social History  ? ?Socioeconomic History  ? Marital status: Unknown  ?  Spouse name: Not on file  ? Number of children: Not on file  ? Years of education: Not on file  ? Highest education level: Not on file  ?Occupational History  ? Not on file  ?Tobacco Use  ? Smoking status: Former  ?  Packs/day: 0.25  ?  Years: 40.00  ?  Pack years: 10.00  ?  Types: Cigarettes  ? Smokeless tobacco: Never  ? Tobacco comments:  ?  stopped years ago  ?Substance and Sexual Activity  ? Alcohol use: Not Currently  ? Drug use: Never  ? Sexual activity: Not on file  ?Other Topics Concern  ? Not on file  ?Social History Narrative  ? Not on file  ? ?Social  Determinants of Health  ? ?Financial Resource Strain: Not on file  ?Food Insecurity: Not on file  ?Transportation Needs: Not on file  ?Physical Activity: Not on file  ?Stress: Not on file  ?Social Connections: Not on file  ?Intimate Partner Violence: Not on file  ? ? ?Review of Systems: ?ROS negative except for what is noted on the assessment and plan. ? ?Vitals:  ? 10/03/21 1003  ?BP: 136/83  ?Pulse: 81  ?Temp: 98 ?F (36.7 ?C)  ?TempSrc: Oral  ?SpO2: 96%  ?Weight: 125 lb (56.7 kg)  ? ? ? ?Physical Exam: ?Constitutional: well-appearing elderly woman sitting in the chair, in no acute distress ?HENT: normocephalic atraumatic, mucous membranes moist ?Eyes: conjunctiva non-erythematous ?Neck: supple ?Cardiovascular: regular rate and rhythm, no m/r/g ?Pulmonary/Chest: normal work of breathing on room air, lungs clear to auscultation bilaterally ?Abdominal: soft, non-tender, non-distended ?MSK: normal bulk and tone; ?Neurological: alert & oriented x 3, 5/5 strength in bilateral upper and lower extremities, normal gait, straight leg test positive bilaterally ?Skin: warm and dry ?Psych: Normal behavior, normal mood ? ? ?Assessment & Plan:  ? ?See Encounters Tab for problem based charting. ? ?Patient discussed with Dr. Dareen Piano ?Timothy Lasso, M.D. ?[facility] Internal Medicine, PGY-1 ?Pager: [phone], Phone: [phone] ?Date 10/03/2021 Time 10:55  AM  ?

## 2021-10-03 NOTE — Assessment & Plan Note (Addendum)
1 month ago patient was prescribed Protonix 40 mg daily for reflux symptoms. Protonix has been relieving her symptoms and denies any complaints at this time. ?

## 2021-10-04 NOTE — Progress Notes (Signed)
Internal Medicine Clinic Attending ? ?Case discussed with Dr. Ariwodo  At the time of the visit.  We reviewed the resident?s history and exam and pertinent patient test results.  I agree with the assessment, diagnosis, and plan of care documented in the resident?s note.  ?

## 2021-10-18 NOTE — Congregational Nurse Program (Signed)
  Dept: [phone]   Congregational Nurse Program Note  Date of Encounter: 10/18/2021  Past Medical History: Past Medical History:  Diagnosis Date   Cataract    Chronic obstructive pulmonary disease (HCC) 07/26/2020   Dyspnea on exertion 05/13/2020   Hypertension     Encounter Details:  CNP Questionnaire - 10/18/21 1021       Questionnaire   Do you give verbal consent to treat you today? Yes    Location Patient Served  [facility]    Visit Setting Church or Organization    Patient Status Refugee    Insurance [facility]    Insurance Referral N/A    Medication N/A    Medical Provider Yes    Screening Referrals N/A    Medical Referral Other   MRI   Medical Appointment Made N/A    Food N/A    Transportation N/A    Housing/Utilities N/A    Interpersonal Safety N/A    Intervention Advocate;Navigate Healthcare System;Case Management;Counsel;Educate;Support    ED Visit Averted Yes    Life-Saving Intervention Made N/A            Patient came in requesting assistance with scheduling MRI. Called MRI and made appointment. Patient has no transportation needs.  [name] Anitria Andon RN BSN PCCN  [facility] [phone]-cell [phone]-office

## 2021-10-20 ENCOUNTER — Other Ambulatory Visit: Payer: Self-pay | Admitting: Internal Medicine

## 2021-10-20 DIAGNOSIS — M5432 Sciatica, left side: Secondary | ICD-10-CM

## 2021-10-23 ENCOUNTER — Ambulatory Visit (HOSPITAL_COMMUNITY): Payer: Medicaid Other

## 2021-10-25 ENCOUNTER — Telehealth: Payer: Self-pay | Admitting: Internal Medicine

## 2021-10-25 IMAGING — DX DG CHEST 2V
2 series · 2 of 2 positions shown · non-contrast
Comparison: None.

CLINICAL DATA: Dyspnea

EXAM:
CHEST - 2 VIEW

[chest pa]
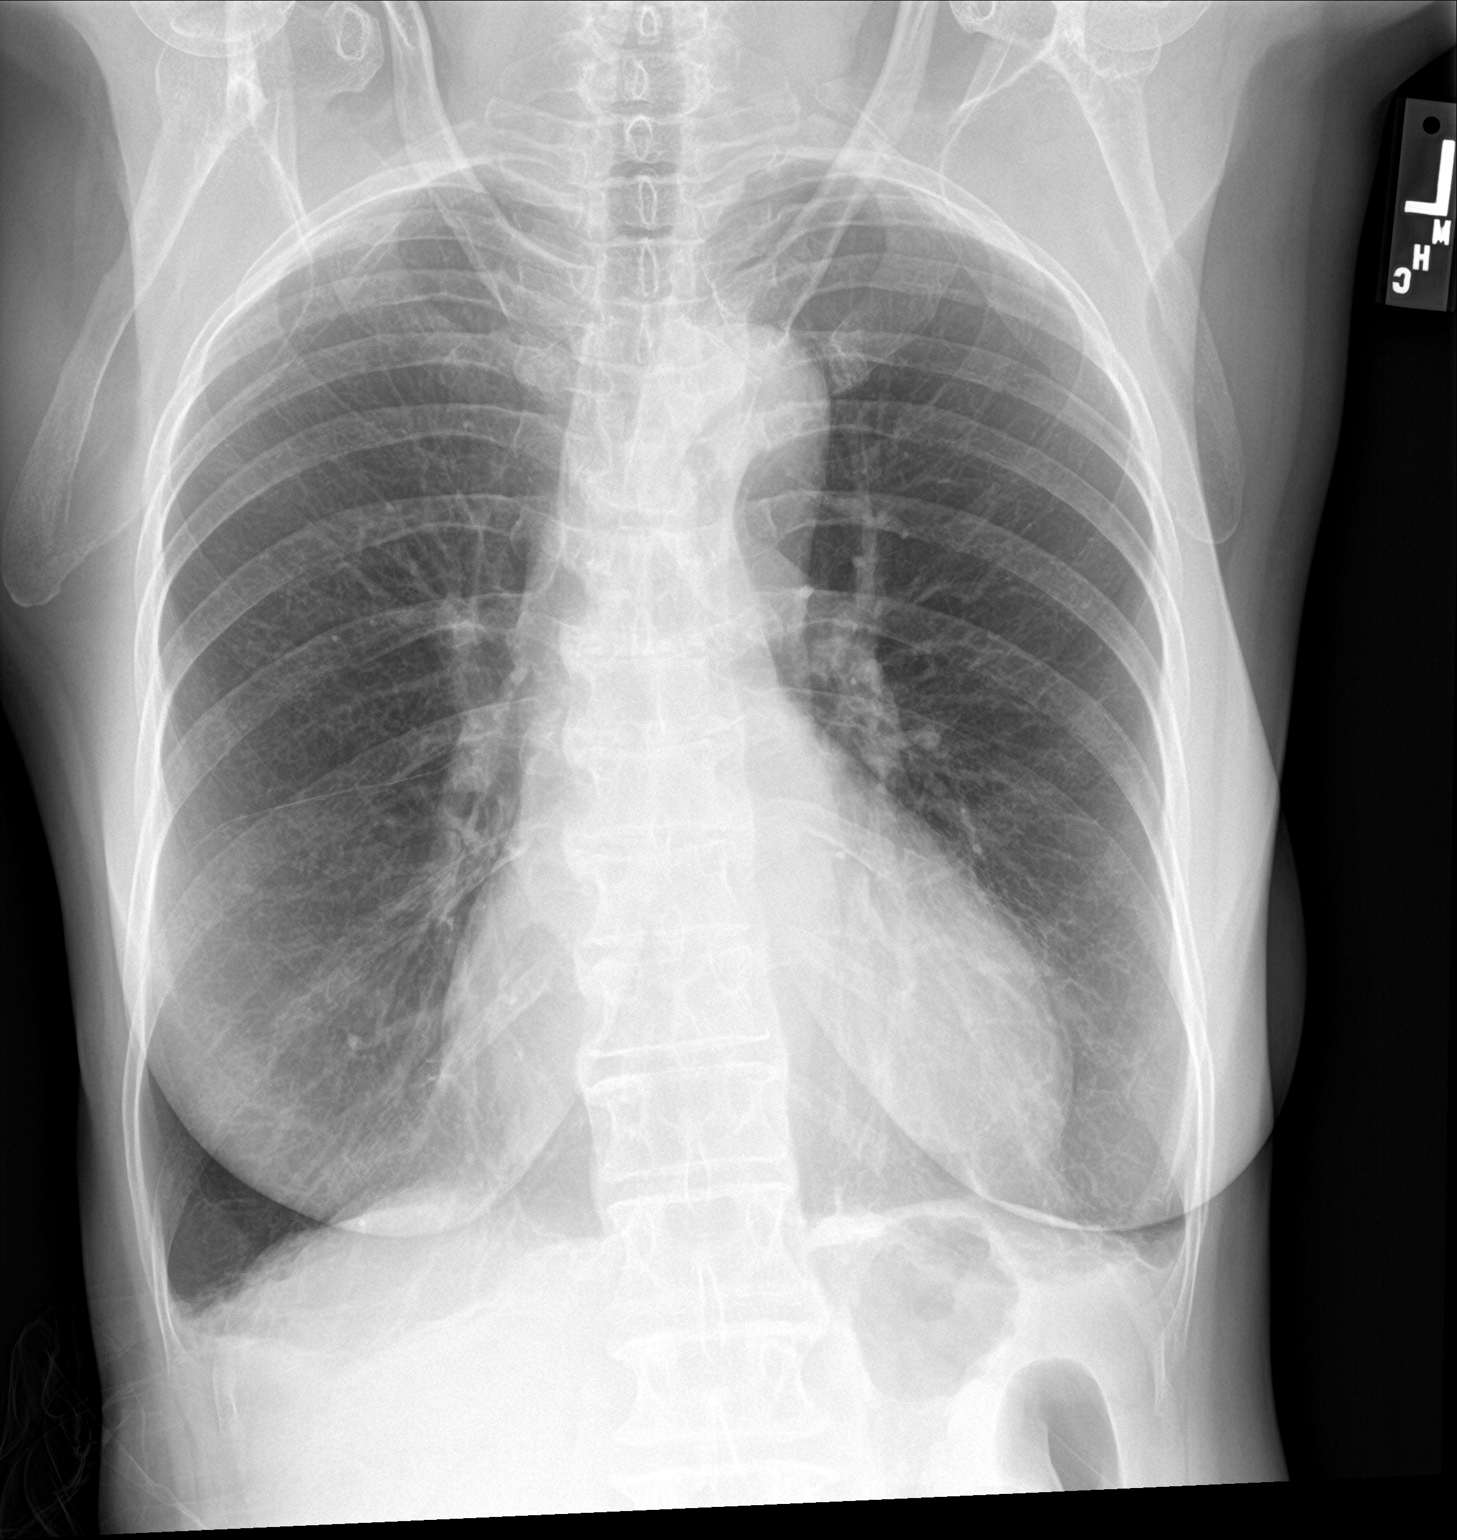

[chest lat]
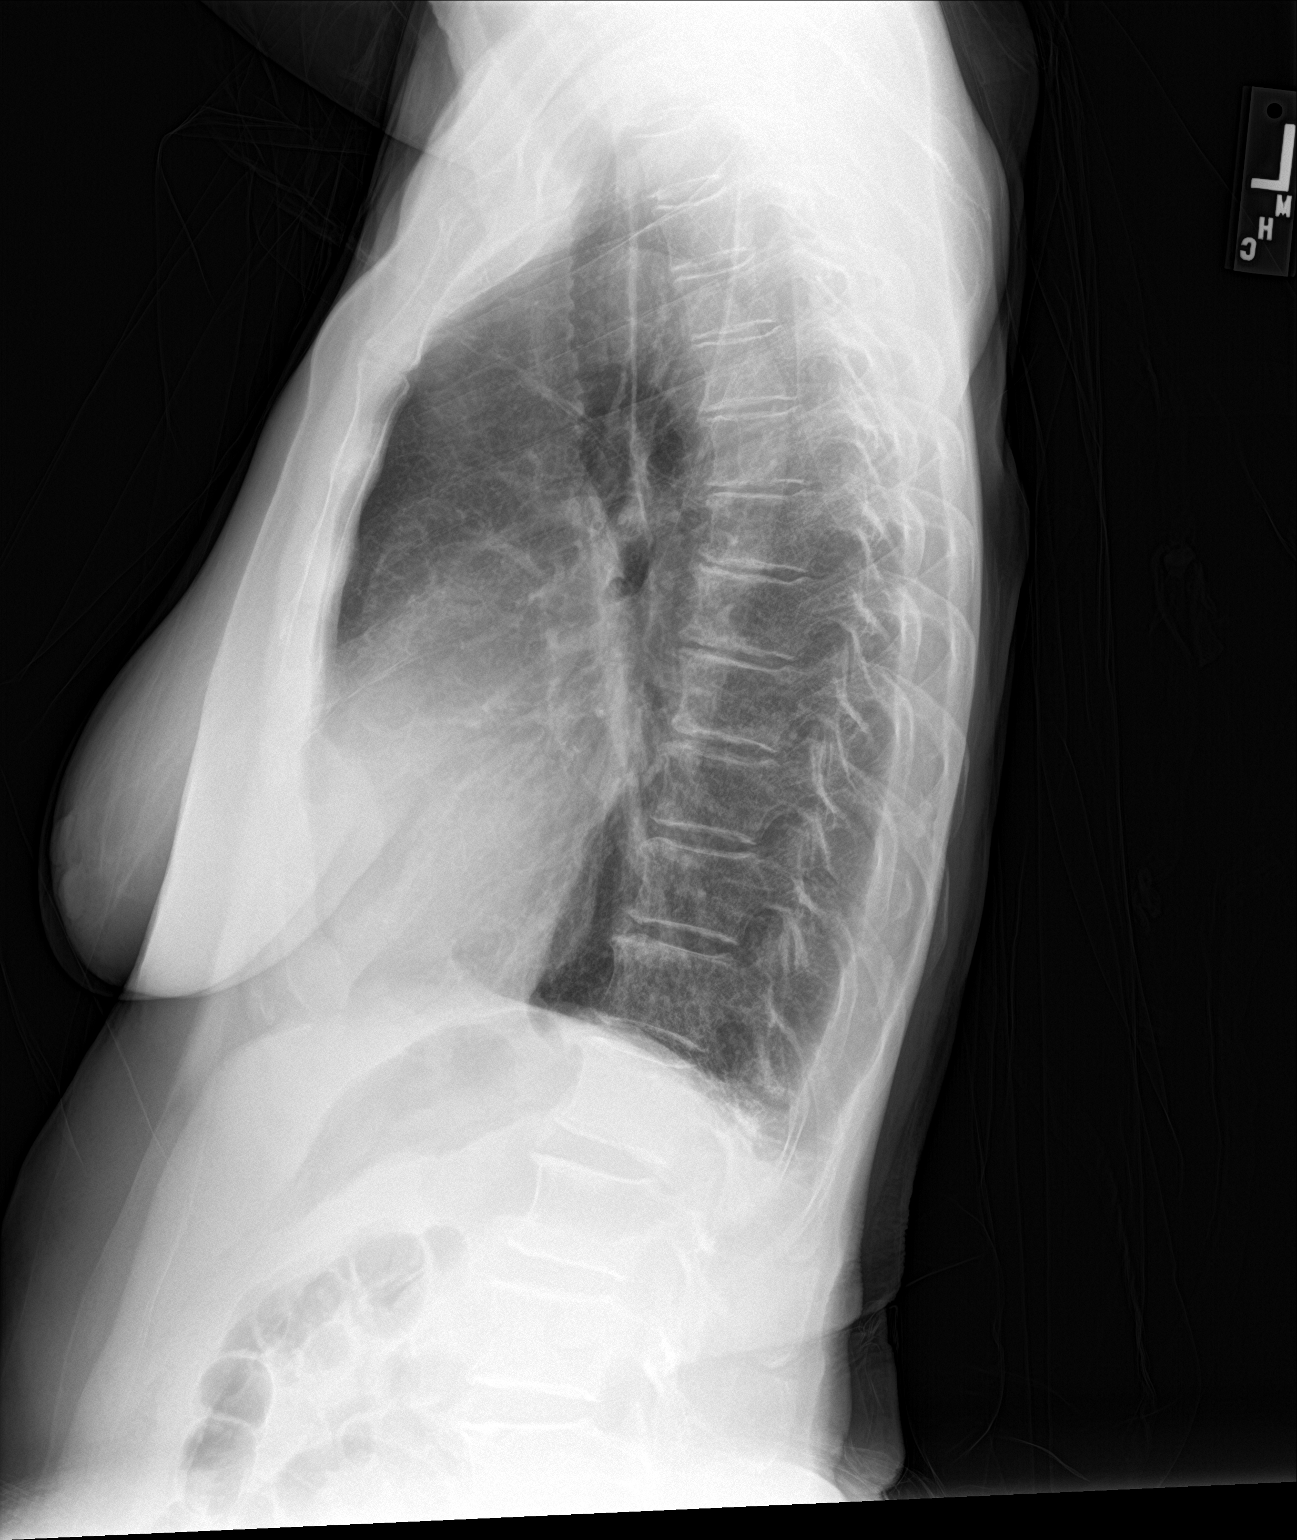

[2 of 2 positions shown; findings below may reference images not displayed]

FINDINGS: Lungs are hyperinflated in keeping with changes of underlying COPD.
The lungs are clear. No pneumothorax or pleural effusion. Cardiac
size is mildly enlarged. Pulmonary vascularity is normal. No acute
bone abnormality.
IMPRESSION: No radiographic evidence of acute cardiopulmonary disease.

COPD

Mild cardiomegaly

## 2021-11-02 NOTE — Congregational Nurse Program (Signed)
  Dept: [phone]   Congregational Nurse Program Note  Date of Encounter: 11/02/2021  Past Medical History: Past Medical History:  Diagnosis Date   Cataract    Chronic obstructive pulmonary disease (HCC) 07/26/2020   Dyspnea on exertion 05/13/2020   Hypertension     Encounter Details:  CNP Questionnaire - 11/02/21 1156       Questionnaire   Do you give verbal consent to treat you today? Yes    Location Patient Served  [facility]    Visit Setting Church or Organization    Patient Status Refugee    Insurance [facility]    Insurance Referral N/A    Medication Have Medication Insecurities;Provided Medication Assistance    Medical Provider Yes    Screening Referrals N/A    Medical Referral N/A    Medical Appointment Made N/A    Food N/A    Transportation N/A    Housing/Utilities N/A    Interpersonal Safety N/A    Intervention Advocate;Case Management;Navigate Healthcare System;Counsel;Educate;Support    ED Visit Averted Yes    Life-Saving Intervention Made N/A           Patient son brought in medication, gabapentin requesting assistance with reading and interpreting the instructions. Patient's son educated on how to take the medication per the pill bottle instructions. He verbalized understanding and also reminded of upcoming radiology appointment. Son will provide transportation.  [name] Cruz Devilla RN BSN PCCN  [facility] [phone]-cell [phone]-office

## 2021-11-03 ENCOUNTER — Telehealth: Payer: Self-pay

## 2021-11-03 NOTE — Telephone Encounter (Signed)
Patient has received a call from radiology informing him that scheduled MRI has been cancelled. Will need pre authorization.I will call PCP office.  Earlie Server Tameika Heckmann RN BSN [place] Nurse [phone]

## 2021-11-05 ENCOUNTER — Ambulatory Visit (HOSPITAL_COMMUNITY): Payer: Medicaid Other

## 2021-11-30 NOTE — Congregational Nurse Program (Signed)
  Dept: [phone]   Congregational Nurse Program Note  Date of Encounter: 11/30/2021  Past Medical History: Past Medical History:  Diagnosis Date   Cataract    Chronic obstructive pulmonary disease (HCC) 07/26/2020   Dyspnea on exertion 05/13/2020   Hypertension     Encounter Details:  CNP Questionnaire - 11/28/21 1006       Questionnaire   Do you give verbal consent to treat you today? Yes    Location Patient Served  [facility]    Visit Setting Church or [address]Phone/Text/Email    Patient Status Refugee    Insurance [employer] Referral N/A    Medication Have Medication Insecurities;Provided Medication Assistance    Medical Provider Yes    Screening Referrals N/A    Medical Referral N/A    Medical Appointment Made N/A    Food N/A    Transportation N/A    Housing/Utilities N/A    Interpersonal Safety N/A    Intervention Advocate;Case Management;Navigate Healthcare System;Counsel;Educate;Support    ED Visit Averted Yes    Life-Saving Intervention Made N/A            11/28/21 Patient son called me requesting assistance to schedule appointment for ongoing leg and back pain. Patient was previously for MRI but cancelled due to need of prior authorization. Patient is still waiting to be contacted for MRI. Appointment scheduled for July 10th With PCP.  [name] Deray Dawes RN BSN PCCN  [facility] [phone]-cell [phone]-office

## 2021-12-05 ENCOUNTER — Other Ambulatory Visit: Payer: Self-pay

## 2021-12-05 ENCOUNTER — Encounter: Payer: Self-pay | Admitting: Student

## 2021-12-05 ENCOUNTER — Ambulatory Visit: Payer: Medicaid Other | Admitting: Student

## 2021-12-05 VITALS — BP 138/79 | HR 80 | Temp 97.7°F | Ht 66.0 in | Wt 123.5 lb

## 2021-12-05 DIAGNOSIS — Z0000 Encounter for general adult medical examination without abnormal findings: Secondary | ICD-10-CM

## 2021-12-05 DIAGNOSIS — M5432 Sciatica, left side: Secondary | ICD-10-CM

## 2021-12-05 NOTE — Assessment & Plan Note (Signed)
Referral placed for DEXA scan

## 2021-12-05 NOTE — Assessment & Plan Note (Addendum)
Assessment: Patient continues to endorse left sided buttock pain that radiates down her leg. It has worsened in intensity over the past month and has decreased her abilitiy to ambulate and perform iADL's without significant pain. She has been evaluated multiple times over the past year for these symptoms. She has been treated conservatively with PT, yoga, gabapentin, and naproxen without relief. Initially referred to physical therapy on 10/22 and continued until 04/23 per documentation. During this time she also endorses trying home exercises. She had minimal relief and from conservative management of tylenol, naproxen, and gabapentin. She has been evaluated 5 times in total the last year for the pain. She and the interpretor she presents with are quite frustrated with this. We discussed how in the past her insurance has denied the MRI request multiple times. A peer to peer was done with her prior PCP and this was also denied. She has failed multiple therapies and conservative management we will place new referral for MRI. Will also place new referral for PT.   Physical exam with positive straight leg raise. Pain is located in the left gluteal region and radiates down her left buttock into her leg and down into her foot. No step off or deformities of the spine. No midline tenderness.   She denies loss of bowel or bladder function, weight loss, night sweats. Denies hx of malignancy. Do not believe she needs emergent imaging or admission.   Plan: -MRI lumbar spine ordered -increase gabapentin dosing to TID -referral placed to PT.  -consider referral for spinal injections depending on MRI

## 2021-12-05 NOTE — Patient Instructions (Signed)
Thank you, [name] for allowing [place] to provide your care today. Today we discussed.  Back Pain We will continue physical therapy and try to get the MRI done. If you have any symptoms of peeing or popping on yourself, new central back pain, weight loss, night sweats, or numbness/tingling in your rectal area, please go to the ED immediately.   Healthcare maintenace We will be placing a referral for a bone scan to check for osteoperosis.     I have ordered the following labs for you:  Lab Orders  No laboratory test(s) ordered today    Referrals ordered today:    Referral Orders         Ambulatory referral to Physical Therapy      I have ordered the following medication/changed the following medications:   Stop the following medications: There are no discontinued medications.   Start the following medications: No orders of the defined types were placed in this encounter.    Follow up:  1 month follow up back pain     Should you have any questions or concerns please call the internal medicine clinic at [phone].    Alyssa Hammond, D.O. [facility]

## 2021-12-05 NOTE — Progress Notes (Signed)
CC: sciatica  HPI:  Alyssa Hammond is a 81 y.o. female living with a history stated below and presents today for sciatica. Please see problem based assessment and plan for additional details. Interpretor present for the visit.   Past Medical History:  Diagnosis Date   Cataract    Chronic obstructive pulmonary disease (HCC) 07/26/2020   Dyspnea on exertion 05/13/2020   Hypertension     Current Outpatient Medications on File Prior to Visit  Medication Sig Dispense Refill   albuterol (VENTOLIN HFA) 108 (90 Base) MCG/ACT inhaler INHALE 2 PUFFS BY MOUTH INTO THE LUNGS EVERY 6 HOURS AS NEEDED FOR WHEEZING AND SHORTNESS OF BREATH 8.5 g 2   amLODipine (NORVASC) 5 MG tablet Take 1 tablet (5 mg total) by mouth daily. 30 tablet 11   diclofenac Sodium (VOLTAREN) 1 % GEL Apply 4 g topically 4 (four) times daily. 4 g 0   gabapentin (NEURONTIN) 300 MG capsule TAKE 1 CAPSULE(300 MG) BY MOUTH AT BEDTIME 90 capsule 0   pantoprazole (PROTONIX) 40 MG tablet Take 1 tablet (40 mg total) by mouth daily. 90 tablet 3   Tiotropium Bromide Monohydrate (SPIRIVA RESPIMAT) 2.5 MCG/ACT AERS Inhale 2 puffs into the lungs daily. 1 each 2   No current facility-administered medications on file prior to visit.    Family History  Family history unknown: Yes    Social History   Socioeconomic History   Marital status: Unknown    Spouse name: Not on file   Number of children: Not on file   Years of education: Not on file   Highest education level: Not on file  Occupational History   Not on file  Tobacco Use   Smoking status: Former    Packs/day: 0.25    Years: 40.00    Total pack years: 10.00    Types: Cigarettes   Smokeless tobacco: Never   Tobacco comments:    stopped years ago  Substance and Sexual Activity   Alcohol use: Not Currently   Drug use: Never   Sexual activity: Not on file  Other Topics Concern   Not on file  Social History Narrative   Not on file   Social Determinants of  Health   Financial Resource Strain: Not on file  Food Insecurity: Not on file  Transportation Needs: Not on file  Physical Activity: Not on file  Stress: Not on file  Social Connections: Not on file  Intimate Partner Violence: Not on file    Review of Systems: ROS negative except for what is noted on the assessment and plan.  Vitals:   12/05/21 1043  BP: 138/79  Pulse: 80  Temp: 97.7 F (36.5 C)  TempSrc: Oral  SpO2: 95%  Weight: 123 lb 8 oz (56 kg)  Height: 5\' 6"  (1.676 m)    Physical Exam: Constitutional: no acute distress HENT: normocephalic atraumatic Eyes: conjunctiva non-erythematous Neck: supple Cardiovascular: regular rate and rhythm, no m/r/g. Dosalis pedis and posterior tibialis pulse 2+ Pulmonary/Chest: normal work of breathing on room air, lungs clear to auscultation bilaterally MSK: normal bulk and tone. Straight leg raise positive on the left. No spinal deformities and not tender to palpate. Neurological: alert & oriented x 3, 5/5 strength in bilateral upper and lower extremities, ambulated with some limitations 2/2 pain Skin: warm and dry. No spinal skin changes Psych: normal mood  Assessment & Plan:   Sciatica of left side Assessment: Patient continues to endorse left sided buttock pain that radiates down her leg.  It has worsened in intensity over the past month and has decreased her abilitiy to ambulate and perform iADL's without significant pain. She has been evaluated multiple times over the past year for these symptoms. She has been treated conservatively with PT, yoga, gabapentin, and naproxen without relief. Initially referred to physical therapy on 10/22 and continued until 04/23 per documentation. During this time she also endorses trying home exercises. She had minimal relief and from conservative management of tylenol, naproxen, and gabapentin. She has been evaluated 5 times in total the last year for the pain. She and the interpretor she presents  with are quite frustrated with this. We discussed how in the past her insurance has denied the MRI request multiple times. A peer to peer was done with her prior PCP and this was also denied. She has failed multiple therapies and conservative management we will place new referral for MRI. Will also place new referral for PT.   Physical exam with positive straight leg raise. Pain is located in the left gluteal region and radiates down her left buttock into her leg and down into her foot. No step off or deformities of the spine. No midline tenderness.   She denies loss of bowel or bladder function, weight loss, night sweats. Denies hx of malignancy. Do not believe she needs emergent imaging or admission.   Plan: -MRI lumbar spine ordered -increase gabapentin dosing to TID -referral placed to PT.  -consider referral for spinal injections depending on MRI  Healthcare maintenance Referral placed for DEXA scan  Patient discussed with Dr. Lorriane Shire, D.O. [facility] Internal Medicine, PGY-3 Phone: [phone] Date 12/05/2021 Time 9:24 PM

## 2021-12-07 NOTE — Progress Notes (Signed)
Internal Medicine Clinic Attending  Case discussed with Dr. Katsadouros  At the time of the visit.  We reviewed the resident's history and exam and pertinent patient test results.  I agree with the assessment, diagnosis, and plan of care documented in the resident's note.  

## 2022-01-13 ENCOUNTER — Other Ambulatory Visit: Payer: Self-pay | Admitting: *Deleted

## 2022-01-13 DIAGNOSIS — J449 Chronic obstructive pulmonary disease, unspecified: Secondary | ICD-10-CM

## 2022-01-13 DIAGNOSIS — R0609 Other forms of dyspnea: Secondary | ICD-10-CM

## 2022-01-13 MED ORDER — SPIRIVA RESPIMAT 2.5 MCG/ACT IN AERS: 2.0000 | INHALATION_SPRAY | Freq: Every day | RESPIRATORY_TRACT | 2 refills | Status: DC

## 2022-01-15 ENCOUNTER — Telehealth: Payer: Self-pay

## 2022-01-15 NOTE — Telephone Encounter (Signed)
Requesting assistance to read a letter from [employer]. Insurance letter stated that MRI authorization was approved for date July 13 th - August 12 th. Informed that the dates have passed and will need a new authorization.  [name] Nikie Cid RN BSN PCCN  [facility] [phone]-cell [phone]-office

## 2022-01-16 ENCOUNTER — Other Ambulatory Visit: Payer: Self-pay

## 2022-01-16 DIAGNOSIS — R0609 Other forms of dyspnea: Secondary | ICD-10-CM

## 2022-01-16 DIAGNOSIS — J449 Chronic obstructive pulmonary disease, unspecified: Secondary | ICD-10-CM

## 2022-01-16 MED ORDER — SPIRIVA RESPIMAT 2.5 MCG/ACT IN AERS: 2.0000 | INHALATION_SPRAY | Freq: Every day | RESPIRATORY_TRACT | 2 refills | Status: AC

## 2022-01-31 NOTE — Congregational Nurse Program (Signed)
  Dept: [phone]   Congregational Nurse Program Note  Date of Encounter: 01/31/2022  Past Medical History: Past Medical History:  Diagnosis Date   Cataract    Chronic obstructive pulmonary disease (HCC) 07/26/2020   Dyspnea on exertion 05/13/2020   Hypertension     Encounter Details:  CNP Questionnaire - 01/31/22 1235       Questionnaire   Do you give verbal consent to treat you today? Yes    Location Patient Served  [facility]    Visit Setting [place] or [address]Phone/Text/Email    Patient Status Refugee    Insurance [employer] Referral N/A    Medication Have Medication Insecurities;Provided Medication Assistance    Medical Provider Yes    Screening Referrals N/A    Medical Referral N/A    Medical Appointment Made N/A    Food N/A    Transportation N/A    Housing/Utilities N/A    Interpersonal Safety N/A    Intervention Advocate;Case Management;Navigate Healthcare System;Counsel;Educate;Support    ED Visit Averted Yes    Life-Saving Intervention Made N/A            MRI is still pending. Son brought in a prior authorization letter that has expired. Patient is now enrolled with [facility] and is scheduled to be seen in 2 days. Patient and son advised to notify [facility] regarding prior authorization for MRI.   [name] Najat Olazabal RN BSN PCCN  [facility] [phone]-cell [phone]-office

## 2022-02-06 ENCOUNTER — Other Ambulatory Visit: Payer: Self-pay | Admitting: Sleep Medicine

## 2022-02-06 ENCOUNTER — Other Ambulatory Visit: Payer: Self-pay | Admitting: Internal Medicine

## 2022-02-06 DIAGNOSIS — M5417 Radiculopathy, lumbosacral region: Secondary | ICD-10-CM

## 2022-02-24 ENCOUNTER — Other Ambulatory Visit: Payer: Self-pay

## 2022-02-24 MED ORDER — AMLODIPINE BESYLATE 5 MG PO TABS: 5.0000 mg | ORAL_TABLET | Freq: Every day | ORAL | 11 refills | Status: AC

## 2022-03-06 NOTE — Congregational Nurse Program (Signed)
Patient's son came in with her protonix and spiriva medications. Provided education on how and when to take both prescriptions. Assisted to prime the spiriva inhaler. Let son know she had an MRI scheduled 10/13. He was unaware. Rescheduled 10/26 at more convenient time for transportation.  Barrett Shell, RN

## 2022-03-10 ENCOUNTER — Other Ambulatory Visit: Payer: Medicaid Other

## 2022-03-22 ENCOUNTER — Telehealth: Payer: Self-pay

## 2022-03-22 NOTE — Telephone Encounter (Signed)
Patient's son came in, wanting to know the dates and time of eye appointment. Unfortunately, it was this morning and they missed it. Rescheduled appointment.  Earlie Server Abram Sax RN BSN [facility] Nurse [phone]

## 2022-03-23 ENCOUNTER — Ambulatory Visit
Admission: RE | Admit: 2022-03-23 | Discharge: 2022-03-23 | Disposition: A | Discharge status: Home / Self Care | Payer: No Typology Code available for payment source | Source: Ambulatory Visit | Attending: Internal Medicine | Admitting: Internal Medicine

## 2022-03-23 DIAGNOSIS — M5417 Radiculopathy, lumbosacral region: Secondary | ICD-10-CM

## 2022-03-23 MED ORDER — GADOPICLENOL 0.5 MMOL/ML IV SOLN
6.0000 mL | Freq: Once | INTRAVENOUS | Status: AC | PRN
  Administered 2022-03-23: 6 mL via INTRAVENOUS

## 2022-04-23 IMAGING — US US ABDOMEN LIMITED RUQ/ASCITES
1 series · 14 of 25 positions shown · non-contrast
Comparison: None.

CLINICAL DATA: Right upper quadrant abdominal pain

EXAM:
ULTRASOUND ABDOMEN LIMITED RIGHT UPPER QUADRANT

[Series 1: us abdomen limited ruq (liver/gb) · 63 acquisitions, 14 frames shown]
[im 1/63]
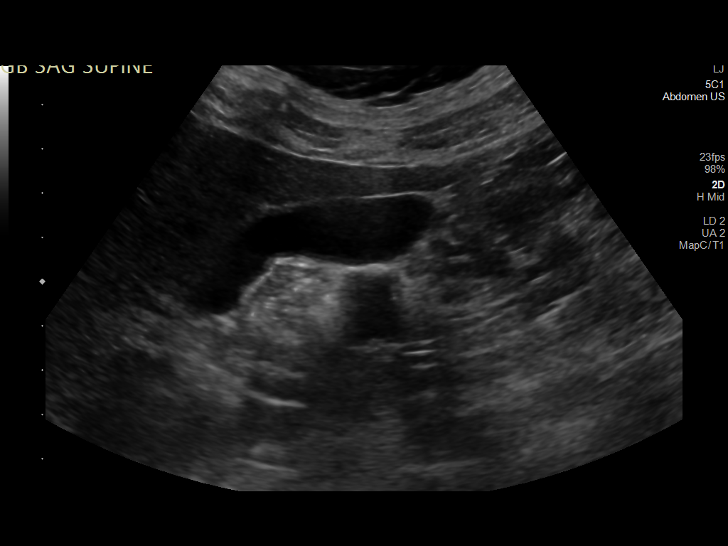
[im 6/63]
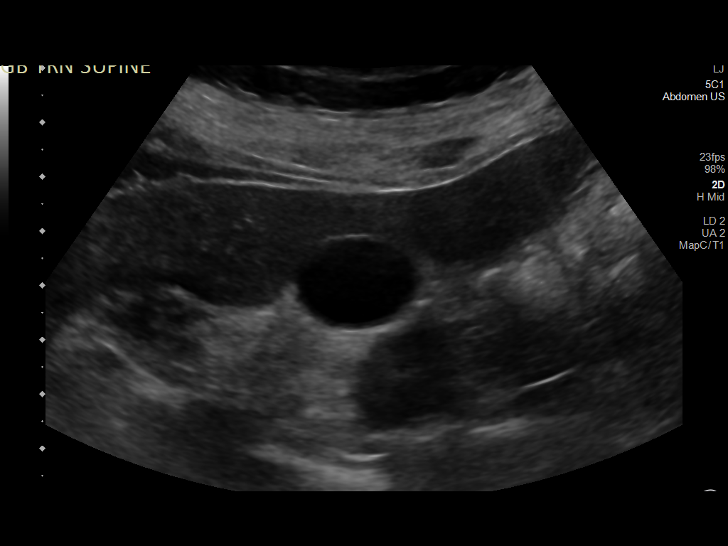
[im 11/63]
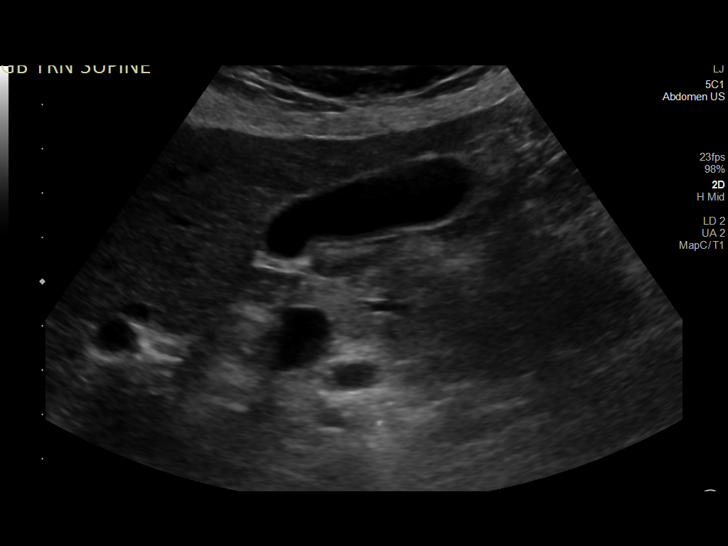
[im 16/63]
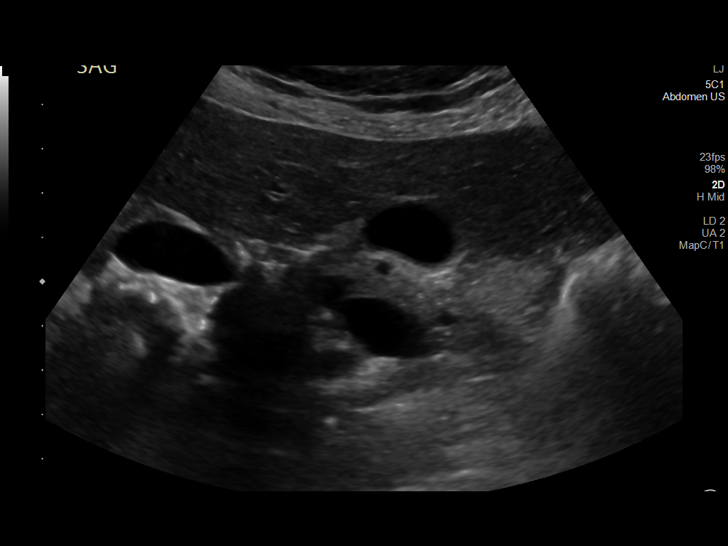
[im 21/63]
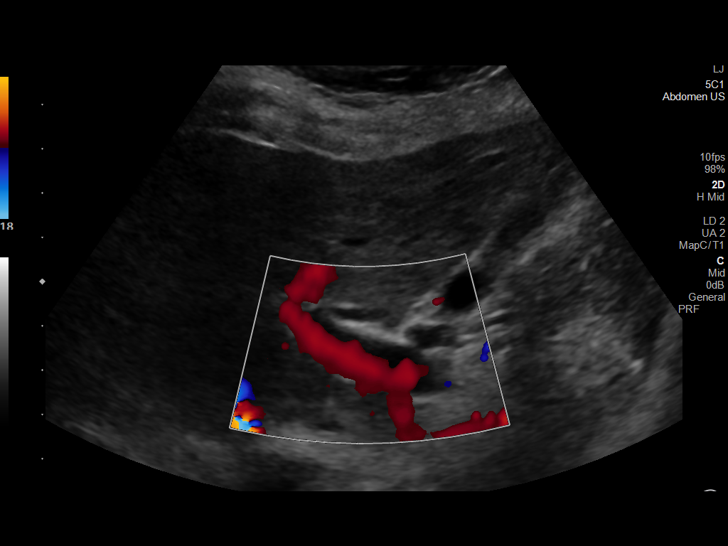
[im 24/63]
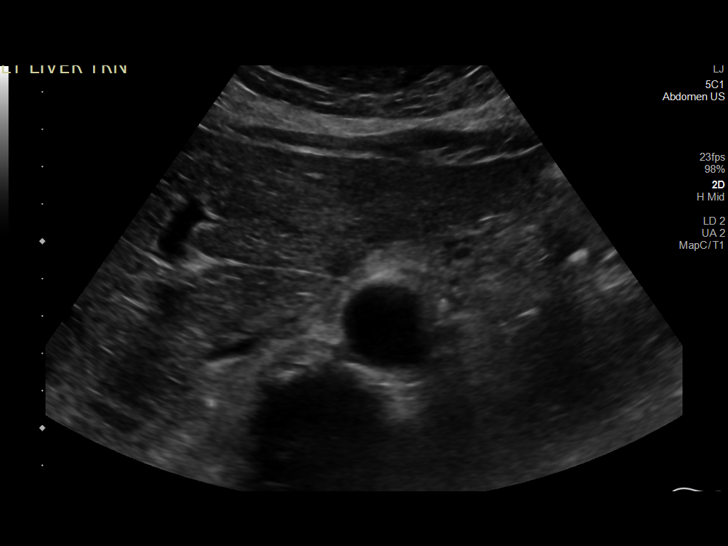
[im 29/63]
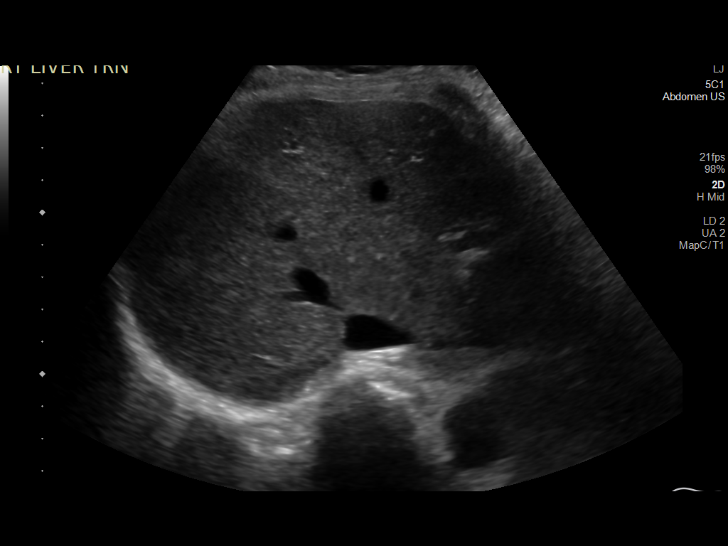
[im 34/63]
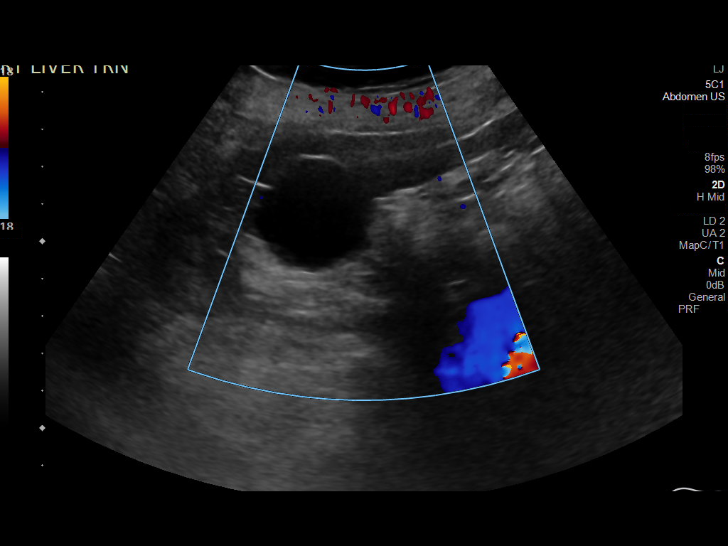
[im 39/63]
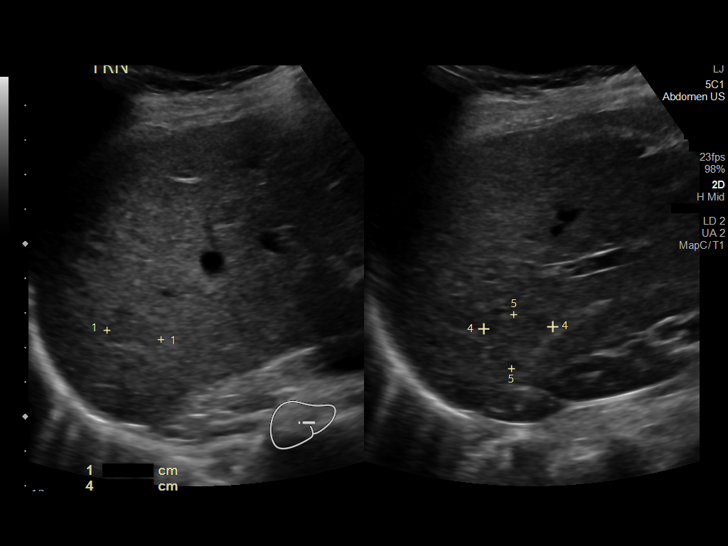
[im 42/63]
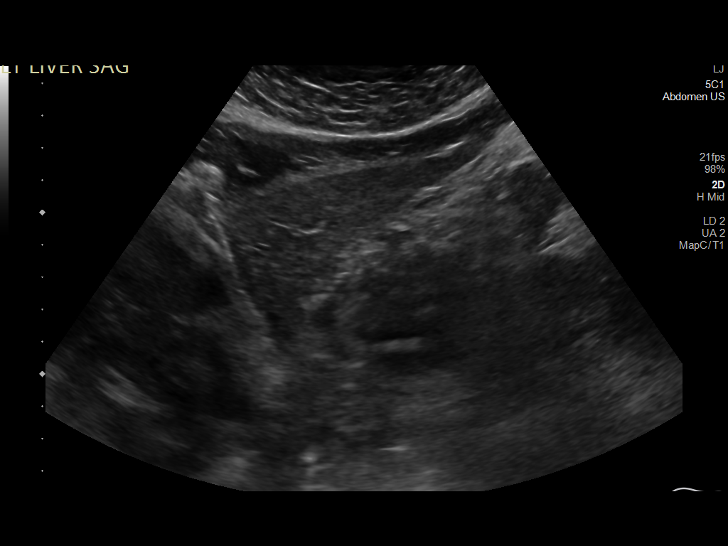
[im 47/63]
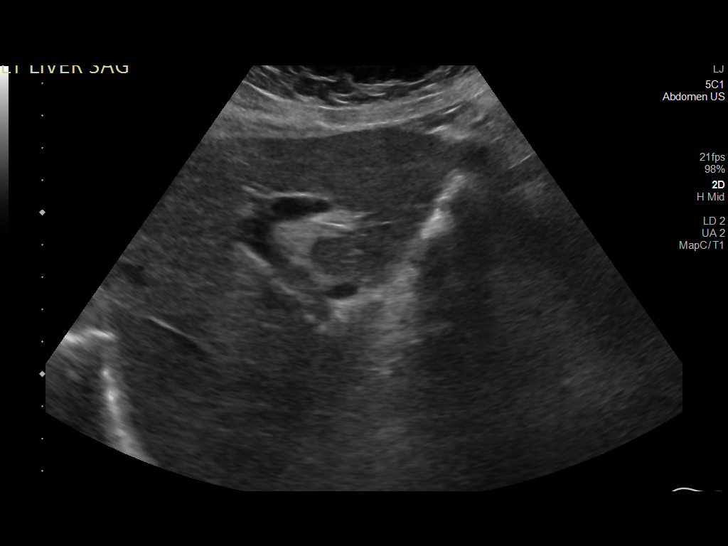
[im 52/63]
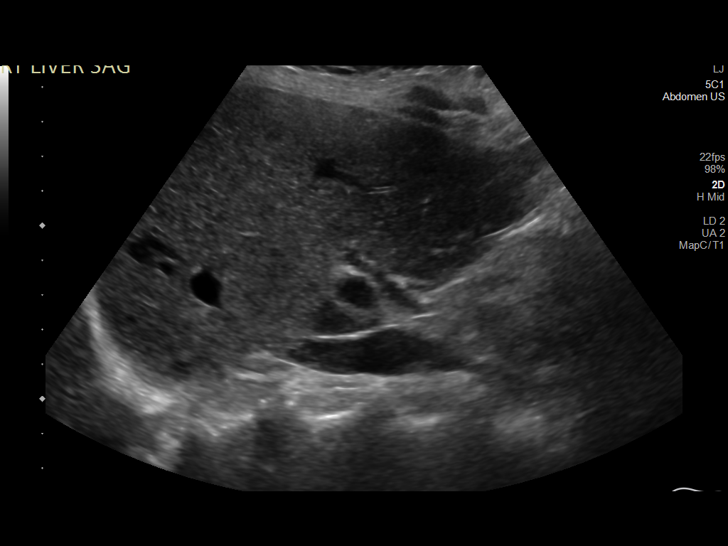
[im 57/63]
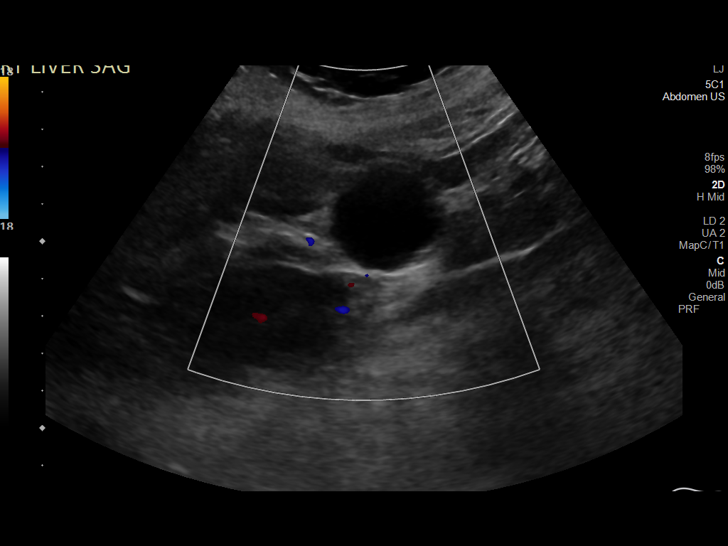
[im 63/63]
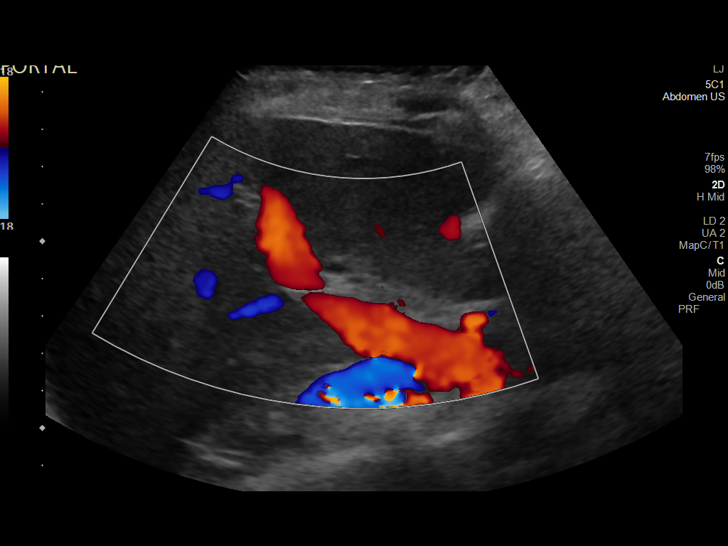

[14 of 25 positions shown; findings below may reference images not displayed]

FINDINGS: Gallbladder:

No gallstones or wall thickening visualized. No sonographic Murphy
sign noted by sonographer.

Common bile duct:

Diameter: 3 mm

Liver:

Mild heterogeneity of hepatic parenchyma. No significant contour
abnormality.

2.0 x 1.6 x 1.6 cm hypoechoic mass seen in the right liver lobe.
x 1.2 x 0.9 cm hypoechoic mass seen in the left liver lobe.

3.2 cm simple cyst seen in the right liver lobe.

Portal vein is patent on color Doppler imaging with normal direction
of blood flow towards the liver.

Other: None.
IMPRESSION: Two questionable hypoechoic masses seen in the liver measuring 2.0 x
1.6 cm in the right and 1.4 x 1.2 x 0.9 cm in the left liver lobe.
Further evaluation with contrast enhanced MRI should be performed to
better evaluate these lesions.

These results will be called to the ordering clinician or
representative by the Radiologist Assistant, and communication
documented in the PACS or [REDACTED].

## 2022-05-10 NOTE — Congregational Nurse Program (Signed)
  Dept: [phone]   Congregational Nurse Program Note  Date of Encounter: 05/10/2022  Past Medical History: Past Medical History:  Diagnosis Date   Cataract    Chronic obstructive pulmonary disease (HCC) 07/26/2020   Dyspnea on exertion 05/13/2020   Hypertension     Encounter Details:  CNP Questionnaire - 05/10/22 1446       Questionnaire   Ask client: Do you give verbal consent for me to treat you today? N/A    Student Assistance N/A    Location Patient Served  [facility]A;[facility]    Visit Setting with Client Organization    Patient Status Refugee    Insurance Unknown    Insurance/Financial Assistance Referral N/A    Medication N/A    Medical Provider Yes    Screening Referrals Made N/A    Medical Referrals Made N/A    Medical Appointment Made [facility]    Recently w/o PCP, now 1st time PCP visit completed due to CNs referral or appointment made N/A    Food N/A    Transportation N/A    Housing/Utilities N/A    Interpersonal Safety N/A    Interventions Advocate/Support;Navigate Healthcare System;Case Management;Educate;Reviewed Medications    Abnormal to Normal Screening Since Last CN Visit N/A    Screenings CN Performed N/A    Sent Client to Lab for: N/A    Did client attend any of the following based off CNs referral or appointments made? N/A    ED Visit Averted N/A    Life-Saving Intervention Made N/A            Patient requesting assistance to call [facility] for appointment date and time reminder. Next appointment is on January 3rd at 2pm. Patient and son made aware.  [name] Lavaughn Haberle RN BSN PCCN  [facility] [phone]-cell [phone]-office

## 2022-09-29 ENCOUNTER — Other Ambulatory Visit (HOSPITAL_COMMUNITY): Payer: Self-pay | Admitting: Family Medicine

## 2022-09-29 ENCOUNTER — Other Ambulatory Visit: Payer: Self-pay | Admitting: Family Medicine

## 2022-09-29 DIAGNOSIS — K219 Gastro-esophageal reflux disease without esophagitis: Secondary | ICD-10-CM

## 2022-10-02 ENCOUNTER — Ambulatory Visit (HOSPITAL_BASED_OUTPATIENT_CLINIC_OR_DEPARTMENT_OTHER)
Admission: RE | Admit: 2022-10-02 | Discharge: 2022-10-02 | Disposition: A | Discharge status: Home / Self Care | Payer: Medicaid Other | Source: Ambulatory Visit | Attending: Student | Admitting: Student

## 2022-10-02 DIAGNOSIS — Z0001 Encounter for general adult medical examination with abnormal findings: Secondary | ICD-10-CM | POA: Insufficient documentation

## 2022-10-02 DIAGNOSIS — K7689 Other specified diseases of liver: Secondary | ICD-10-CM | POA: Insufficient documentation

## 2022-10-02 DIAGNOSIS — K824 Cholesterolosis of gallbladder: Secondary | ICD-10-CM | POA: Diagnosis not present

## 2022-10-02 DIAGNOSIS — K219 Gastro-esophageal reflux disease without esophagitis: Secondary | ICD-10-CM | POA: Insufficient documentation

## 2022-10-04 NOTE — Congregational Nurse Program (Signed)
  Dept: [phone]   Congregational Nurse Program Note  Date of Encounter: 10/04/2022  Past Medical History: Past Medical History:  Diagnosis Date   Cataract    Chronic obstructive pulmonary disease (HCC) 07/26/2020   Dyspnea on exertion 05/13/2020   Hypertension     Encounter Details:  CNP Questionnaire - 10/04/22 1344       Questionnaire   Ask client: Do you give verbal consent for me to treat you today? N/A    Student Assistance N/A    Location Patient Served  [facility]/A;[facility]    Visit Setting with Client Organization    Patient Status Refugee    Insurance Unknown    Insurance/Financial Assistance Referral N/A    Medication N/A    Medical Provider Yes    Screening Referrals Made N/A    Medical Referrals Made N/A    Medical Appointment Made [facility]    Recently w/o PCP, now 1st time PCP visit completed due to CNs referral or appointment made N/A    Food N/A    Transportation N/A    Housing/Utilities N/A    Interpersonal Safety N/A    Interventions Advocate/Support;Navigate Healthcare System;Case Management;Educate;Reviewed Medications    Abnormal to Normal Screening Since Last CN Visit N/A    Screenings CN Performed Blood Pressure    Sent Client to Lab for: N/A    Did client attend any of the following based off CNs referral or appointments made? N/A    ED Visit Averted Yes    Life-Saving Intervention Made N/A            Patient came in to see congregational RN for routine blood pressure monitoring. She is followed at [facility].  [name] Chesley Valls RN BSN PCCN  [facility] [phone]-cell [phone]-office

## 2022-11-08 NOTE — Congregational Nurse Program (Signed)
  Dept: [phone]   Congregational Nurse Program Note  Date of Encounter: 11/08/2022  Past Medical History: Past Medical History:  Diagnosis Date   Cataract    Chronic obstructive pulmonary disease (HCC) 07/26/2020   Dyspnea on exertion 05/13/2020   Hypertension     Encounter Details:  CNP Questionnaire - 11/08/22 1212       Questionnaire   Ask client: Do you give verbal consent for me to treat you today? N/A    Student Assistance [employer] Nurse    Location Patient Served  [facility]    Visit Setting with Client Organization    Patient Status Refugee    Insurance Unknown    Insurance/Financial Assistance Referral N/A    Medication N/A    Medical Provider Yes    Screening Referrals Made N/A    Medical Referrals Made N/A    Medical Appointment Made N/A    Recently w/o PCP, now 1st time PCP visit completed due to CNs referral or appointment made N/A    Food N/A    Transportation N/A    Housing/Utilities N/A    Interpersonal Safety N/A    Interventions Advocate/Support;Navigate Healthcare System;Case Management;Educate;Reviewed Medications    Abnormal to Normal Screening Since Last CN Visit N/A    Screenings CN Performed Blood Pressure    Sent Client to Lab for: N/A    Did client attend any of the following based off CNs referral or appointments made? N/A    ED Visit Averted Yes    Life-Saving Intervention Made N/A            Pt visited clinic today requesting how to use her inhaler, Spiriva. RN demonstrated proper medication administration. All questions answered.    [name] Kruz Chiu RN BSN PCCN  [facility] [phone]-cell [phone]-office

## 2022-11-22 NOTE — Congregational Nurse Program (Signed)
  Dept: [phone]   Congregational Nurse Program Note  Date of Encounter: 11/22/2022  Past Medical History: Past Medical History:  Diagnosis Date   Cataract    Chronic obstructive pulmonary disease (HCC) 07/26/2020   Dyspnea on exertion 05/13/2020   Hypertension     Encounter Details:  CNP Questionnaire - 11/22/22 1226       Questionnaire   Ask client: Do you give verbal consent for me to treat you today? Yes    Student Assistance [employer] Nurse    Location Patient Served  [facility]    Visit Setting with Client Organization    Patient Status Refugee    Insurance Unknown    Insurance/Financial Assistance Referral N/A    Medication N/A    Medical Provider Yes    Screening Referrals Made N/A    Medical Referrals Made N/A    Medical Appointment Made N/A    Recently w/o PCP, now 1st time PCP visit completed due to CNs referral or appointment made N/A    Food N/A    Transportation N/A    Housing/Utilities N/A    Interpersonal Safety N/A    Interventions Advocate/Support;Navigate Healthcare System;Case Management;Educate    Abnormal to Normal Screening Since Last CN Visit N/A    Screenings CN Performed Blood Pressure    Sent Client to Lab for: N/A    Did client attend any of the following based off CNs referral or appointments made? N/A    ED Visit Averted Yes    Life-Saving Intervention Made N/A            Pt visited clinic today to have her blood pressure checked. Blood pressure was in within normal limits. Pt also stated that she has been constipated. RN suggested prune juice and Miralax OTC.    [name] Gilberte Gorley RN BSN PCCN  [facility] [phone]-cell [phone]-office

## 2022-12-19 ENCOUNTER — Encounter: Payer: Self-pay | Admitting: *Deleted

## 2022-12-19 NOTE — Congregational Nurse Program (Signed)
  Dept: [phone]   Congregational Nurse Program Note  Date of Encounter: 12/19/2022  Past Medical History: Past Medical History:  Diagnosis Date   Cataract    Chronic obstructive pulmonary disease (HCC) 07/26/2020   Dyspnea on exertion 05/13/2020   Hypertension     Encounter Details:  CNP Questionnaire - 12/19/22 1300       Questionnaire   Ask client: Do you give verbal consent for me to treat you today? Yes    Student Assistance N/A    Location Patient Served  [facility]    Visit Setting with Client Organization    Patient Status Refugee    Insurance Unknown    Insurance/Financial Assistance Referral N/A    Medication N/A    Medical Provider Yes    Screening Referrals Made N/A    Medical Referrals Made N/A    Medical Appointment Made [facility]    Recently w/o PCP, now 1st time PCP visit completed due to CNs referral or appointment made N/A    Food N/A    Transportation N/A    Housing/Utilities N/A    Interpersonal Safety N/A    Interventions Advocate/Support;Navigate Healthcare System;Case Management;Educate    Abnormal to Normal Screening Since Last CN Visit N/A    Screenings CN Performed Blood Pressure    Sent Client to Lab for: N/A    Did client attend any of the following based off CNs referral or appointments made? N/A    ED Visit Averted Yes    Life-Saving Intervention Made N/A             Client came into nurse clinic at [facility] with complaints of difficulty breathing.  She showed me two inhalers.  She also said she felt some swelling discomfort under her right breast.  Client would like appointment with an MD.  I will call internal medicine and try to get an appointment for client.  Roderic Palau, RN, MSN, CNP [phone] Office [phone] Cell

## 2022-12-20 ENCOUNTER — Encounter: Payer: Self-pay | Admitting: *Deleted

## 2022-12-20 NOTE — Congregational Nurse Program (Signed)
  Dept: [phone]   Congregational Nurse Program Note  Date of Encounter: 12/20/2022  Past Medical History: Past Medical History:  Diagnosis Date   Cataract    Chronic obstructive pulmonary disease (HCC) 07/26/2020   Dyspnea on exertion 05/13/2020   Hypertension     Encounter Details:  CNP Questionnaire - 12/20/22 1248       Questionnaire   Ask client: Do you give verbal consent for me to treat you today? Yes    Student Assistance N/A    Location [facility] Served  [facility]    Visit Setting with Client Organization    Patient Status Refugee    Insurance Unknown    Insurance/Financial Assistance Referral N/A    Medication N/A    Medical Provider Yes    Screening Referrals Made N/A    Medical Referrals Made [facility]    Medical Appointment Made [facility]    Recently w/o PCP, now 1st time PCP visit completed due to CNs referral or appointment made N/A    Food N/A    Transportation N/A    Housing/Utilities N/A    Interpersonal Safety N/A    Interventions Advocate/Support;Navigate Healthcare System;Case Management;Educate    Abnormal to Normal Screening Since Last CN Visit N/A    Screenings CN Performed N/A    Sent Client to Lab for: N/A    Did client attend any of the following based off CNs referral or appointments made? N/A    ED Visit Averted Yes    Life-Saving Intervention Made N/A            Appointment made for client at internal medicine clinic for August 8 at 8:45 am.  This CN texted client appointment time and date.  Will follow up again in person to inform client of appointment if possible.    Roderic Palau, RN, MSN, CNP [phone] Office [phone] Cell

## 2022-12-26 ENCOUNTER — Encounter: Payer: Self-pay | Admitting: *Deleted

## 2022-12-26 NOTE — Congregational Nurse Program (Signed)
  Dept: [phone]   Congregational Nurse Program Note  Date of Encounter: 12/26/2022  Past Medical History: Past Medical History:  Diagnosis Date   Cataract    Chronic obstructive pulmonary disease (HCC) 07/26/2020   Dyspnea on exertion 05/13/2020   Hypertension     Encounter Details:  CNP Questionnaire - 12/26/22 1113       Questionnaire   Ask client: Do you give verbal consent for me to treat you today? Yes    Student Assistance N/A    Location Patient Served  [facility]    Visit Setting with Client Organization    Patient Status Refugee    Insurance Unknown    Insurance/Financial Assistance Referral N/A    Medication N/A    Medical Provider Yes    Screening Referrals Made N/A    Medical Referrals Made [facility]    Medical Appointment Made [facility]    Recently w/o PCP, now 1st time PCP visit completed due to CNs referral or appointment made N/A    Food N/A    Transportation N/A    Housing/Utilities N/A    Interpersonal Safety N/A    Interventions Advocate/Support;Navigate Healthcare System;Case Management;Educate;Counsel    Abnormal to Normal Screening Since Last CN Visit N/A    Screenings CN Performed N/A    Sent Client to Lab for: N/A    Did client attend any of the following based off CNs referral or appointments made? N/A    ED Visit Averted Yes    Life-Saving Intervention Made N/A            I met with client this am at [facility].  Gave client information about appointment made with IM clinic; I wrote out the information and used an interpreter to explain the appointment.  Client wanted this CN to engage here inhaler, but I was not able to complete the request.  Instructed client to go to pharmacy to have them engage the inhaler for the new cartridge.    Roderic Palau, RN, MSN, CNP [phone] Office [phone] Cell

## 2023-01-04 ENCOUNTER — Ambulatory Visit (INDEPENDENT_AMBULATORY_CARE_PROVIDER_SITE_OTHER): Payer: Medicaid Other | Admitting: Student

## 2023-01-04 ENCOUNTER — Encounter: Payer: Self-pay | Admitting: Student

## 2023-01-04 VITALS — BP 122/76 | HR 82 | Temp 97.7°F | Wt 129.2 lb

## 2023-01-04 DIAGNOSIS — Z87891 Personal history of nicotine dependence: Secondary | ICD-10-CM

## 2023-01-04 DIAGNOSIS — I10 Essential (primary) hypertension: Secondary | ICD-10-CM | POA: Diagnosis not present

## 2023-01-04 DIAGNOSIS — J449 Chronic obstructive pulmonary disease, unspecified: Secondary | ICD-10-CM

## 2023-01-04 NOTE — Progress Notes (Addendum)
Subjective:  CC: Appointment made by congregational nurse  HPI:  Alyssa Hammond is a 82 y.o. female with a past medical history stated below and presents today at the request of her congregational nurse. She denies any acute changes to the chronic medical conditions. Denies chest pain or shorness of breath today.  Denies increase in weight, swelling, or changes in her vision. She was confused about why she came to this office and reports that she has been getting her medical care through [facility] program and has been adequately filling her medications. She is usually evaluated by them every 6 months or with acute concerns. Last outpatient visit per patient was 2 weeks ago and she needs to go back to review laboratory work, but had difficulty arranging their transport.She gets transportation from this program and get social support from her son. She will engage her son in helping her with [facility] transportation line as she needs interpreter for phone communication.   Past Medical History:  Diagnosis Date   Cataract    Chronic obstructive pulmonary disease (HCC) 07/26/2020   Dyspnea on exertion 05/13/2020   Hypertension     Current Outpatient Medications on File Prior to Visit  Medication Sig Dispense Refill   albuterol (VENTOLIN HFA) 108 (90 Base) MCG/ACT inhaler INHALE 2 PUFFS BY MOUTH INTO THE LUNGS EVERY 6 HOURS AS NEEDED FOR WHEEZING AND SHORTNESS OF BREATH 8.5 g 2   amLODipine (NORVASC) 5 MG tablet Take 1 tablet (5 mg total) by mouth daily. 30 tablet 11   diclofenac Sodium (VOLTAREN) 1 % GEL Apply 4 g topically 4 (four) times daily. 4 g 0   gabapentin (NEURONTIN) 300 MG capsule TAKE 1 CAPSULE(300 MG) BY MOUTH AT BEDTIME 90 capsule 0   pantoprazole (PROTONIX) 40 MG tablet Take 1 tablet (40 mg total) by mouth daily. 90 tablet 3   Tiotropium Bromide Monohydrate (SPIRIVA RESPIMAT) 2.5 MCG/ACT AERS Inhale 2 puffs into the lungs daily. 1 each 2   No current facility-administered  medications on file prior to visit.    Family History  Family history unknown: Yes    Social History   Socioeconomic History   Marital status: Unknown    Spouse name: Not on file   Number of children: Not on file   Years of education: Not on file   Highest education level: Not on file  Occupational History   Not on file  Tobacco Use   Smoking status: Former    Current packs/day: 0.25    Average packs/day: 0.3 packs/day for 40.0 years (10.0 ttl pk-yrs)    Types: Cigarettes   Smokeless tobacco: Never   Tobacco comments:    stopped years ago  Substance and Sexual Activity   Alcohol use: Not Currently   Drug use: Never   Sexual activity: Not on file  Other Topics Concern   Not on file  Social History Narrative   Not on file   Social Determinants of Health   Financial Resource Strain: Not on file  Food Insecurity: Not on file  Transportation Needs: Not on file  Physical Activity: Not on file  Stress: Not on file  Social Connections: Not on file  Intimate Partner Violence: Not on file    Review of Systems: ROS negative except for what is noted on the assessment and plan.  Objective:   Vitals:   01/04/23 0848  BP: 122/76  Pulse: 82  Temp: 97.7 F (36.5 C)  TempSrc: Oral  SpO2: 96%  Weight:  129 lb 3.2 oz (58.6 kg)    Physical Exam: Constitutional: well-appearing elderly woman sitting in chair, in no acute distress HENT: normocephalic atraumatic, mucous membranes moist Eyes: conjunctiva non-erythematous Neck: supple Cardiovascular: regular rate and rhythm, no m/r/g Pulmonary/Chest: normal work of breathing on room air, lungs clear to auscultation bilaterally Abdominal: soft, non-tender, non-distended MSK: normal bulk and tone. No lower extremity edema Neurological: alert & oriented x 3, normal gait Skin: warm and dry Psych: Pleasant mood and affect       01/04/2023    8:52 AM  Depression screen PHQ 2/9  Decreased Interest 0  Down, Depressed,  Hopeless 0  PHQ - 2 Score 0        No data to display           Assessment & Plan:   Chronic obstructive pulmonary disease (HCC) Using inhalers though unable to name them. No increased shortness of breath today. No wheezing noted on exam.  - See [facility] program for chronic disease management  Hypertension At goal and asymptomatic   Patient is no longer an [facility] patient. No acute concerns today. Discuss need to return to [facility]  to review recent visit results that are not visible to me on this platform. If patient has acute concerns, please let us know. No charge for this visit today.  Patient discussed with Dr. Alfonse Alpers, MD [facility] Internal Medicine Program 01/04/2023, 9:16 AM

## 2023-01-04 NOTE — Assessment & Plan Note (Signed)
At goal and asymptomatic

## 2023-01-04 NOTE — Assessment & Plan Note (Signed)
Using inhalers though unable to name them. No increased shortness of breath today. No wheezing noted on exam.  - See [facility] program for chronic disease management

## 2023-01-15 NOTE — Progress Notes (Signed)
 Internal Medicine Clinic Attending  Case discussed with the resident at the time of the visit.  We reviewed the resident's history and exam and pertinent patient test results.  I agree with the assessment, diagnosis, and plan of care documented in the resident's note.  Debe Coder, MD

## 2023-01-17 NOTE — Congregational Nurse Program (Signed)
  Dept: [phone]   Congregational Nurse Program Note  Date of Encounter: 01/17/2023  Past Medical History: Past Medical History:  Diagnosis Date   Cataract    Chronic obstructive pulmonary disease (HCC) 07/26/2020   Dyspnea on exertion 05/13/2020   Hypertension     Encounter Details:  CNP Questionnaire - 01/17/23 1301       Questionnaire   Ask client: Do you give verbal consent for me to treat you today? Yes    Student Assistance N/A    Location [facility] Served  [facility]    Visit Setting with Client Organization    Patient Status Refugee    Insurance Unknown    Insurance/Financial Assistance Referral N/A    Medication N/A    Medical Provider Yes    Screening Referrals Made N/A    Medical Referrals Made N/A    Medical Appointment Made N/A    Recently w/o PCP, now 1st time PCP visit completed due to CNs referral or appointment made N/A    Food N/A    Transportation N/A    Housing/Utilities N/A    Interpersonal Safety N/A    Interventions Advocate/Support;Navigate Healthcare System;Case Management;Educate;Counsel;Reviewed Medications    Abnormal to Normal Screening Since Last CN Visit N/A    Screenings CN Performed N/A    Sent Client to Lab for: N/A    Did client attend any of the following based off CNs referral or appointments made? N/A    ED Visit Averted Yes    Life-Saving Intervention Made N/A           Patient came in to see Congregational RN for follow up BP check. She brought her inhalers and wanted to know how to tell when the inhaler is about to get finished. She stated that she occasionally runs out and would like to know so that she can refill prior to completley running out.  I have shown her the indicators to tell when the inhaler is running low. She verbalized understanding.  I will continue to assist as needed.  [name] Kvon Mcilhenny RN BSN PCCN  [facility] [phone]-cell [phone]-office

## 2023-01-24 NOTE — Congregational Nurse Program (Signed)
  Dept: [phone]   Congregational Nurse Program Note  Date of Encounter: 01/24/2023  Past Medical History: Past Medical History:  Diagnosis Date   Cataract    Chronic obstructive pulmonary disease (HCC) 07/26/2020   Dyspnea on exertion 05/13/2020   Hypertension     Encounter Details:  CNP Questionnaire - 01/24/23 1309       Questionnaire   Ask client: Do you give verbal consent for me to treat you today? Yes    Student Assistance N/A    Location [facility] Served  [facility]    Visit Setting with Client Organization    Patient Status Refugee    Insurance Unknown    Insurance/Financial Assistance Referral N/A    Medication N/A    Medical Provider Yes    Screening Referrals Made N/A    Medical Referrals Made N/A    Medical Appointment Made N/A    Recently w/o PCP, now 1st time PCP visit completed due to CNs referral or appointment made N/A    Food N/A    Transportation N/A    Housing/Utilities N/A    Interpersonal Safety N/A    Interventions Advocate/Support;Navigate Healthcare System;Case Management;Educate;Counsel;Reviewed Medications    Abnormal to Normal Screening Since Last CN Visit N/A    Screenings CN Performed N/A    Sent Client to Lab for: N/A    Did client attend any of the following based off CNs referral or appointments made? N/A    ED Visit Averted Yes    Life-Saving Intervention Made N/A             Patient came in for routine blood pressure check 121/81 education on heart health low salt diet provided.  [name] Carla Whilden RN BSN PCCN  [facility] [phone]-cell [phone]-office

## 2023-01-30 DIAGNOSIS — R7309 Other abnormal glucose: Secondary | ICD-10-CM

## 2023-01-30 LAB — GLUCOSE, POCT (MANUAL RESULT ENTRY): POC Glucose: 122 mg/dL — AB (ref 70–99)

## 2023-01-30 NOTE — Congregational Nurse Program (Signed)
  Dept: [phone]   Congregational Nurse Program Note  Date of Encounter: 01/30/2023  Past Medical History: Past Medical History:  Diagnosis Date   Cataract    Chronic obstructive pulmonary disease (HCC) 07/26/2020   Dyspnea on exertion 05/13/2020   Hypertension     Encounter Details:  CNP Questionnaire - 01/30/23 1123       Questionnaire   Ask client: Do you give verbal consent for me to treat you today? Yes    Student Assistance [employer] Nurse    Location [facility] Served  [facility]    Visit Setting with Client Organization    Patient Status Refugee    Insurance Unknown    Insurance/Financial Assistance Referral N/A    Medication N/A    Medical Provider Yes    Screening Referrals Made N/A    Medical Referrals Made N/A    Medical Appointment Made N/A    Recently w/o PCP, now 1st time PCP visit completed due to CNs referral or appointment made N/A    Food N/A    Transportation N/A    Housing/Utilities N/A    Interpersonal Safety N/A    Interventions Advocate/Support;Navigate Healthcare System;Case Management;Educate;Counsel;Reviewed Medications    Abnormal to Normal Screening Since Last CN Visit N/A    Screenings CN Performed N/A    Sent Client to Lab for: N/A    Did client attend any of the following based off CNs referral or appointments made? N/A    ED Visit Averted Yes    Life-Saving Intervention Made N/A            Patient brought in medications, education was provided on how to take them. Blood pressure and glucose readings were both taken. Blood sugar was 122, BP was 142/88. All questions answered.  [name] Jenetta Wease RN BSN PCCN  [facility] [phone]-cell [phone]-office

## 2023-02-13 NOTE — Congregational Nurse Program (Cosign Needed)
Patient presenting for blood pressure check. No complaints otherwise. BP 135/84. Prescribed amlodipine 5mg  daily; patient endorses taking every evening. Encouraged reduction in salt intake and increased exercise and walking.

## 2023-02-13 NOTE — Congregational Nurse Program (Signed)
  Dept: [phone]   Congregational Nurse Program Note  Date of Encounter: 02/13/2023  Past Medical History: Past Medical History:  Diagnosis Date   Cataract    Chronic obstructive pulmonary disease (HCC) 07/26/2020   Dyspnea on exertion 05/13/2020   Hypertension     Encounter Details:  She came in for BP monitoring and it was 133/81 with a HR of 83 the first time, then 135/84 with a HR of 87 the second time. Patient is compliant with medication and advised on a healthy diet to reduce salt intake and to exercise.  [name] Margarita Croke RN BSN PCCN  [facility] [phone]-cell [phone]-office

## 2023-03-06 NOTE — Congregational Nurse Program (Signed)
Pt reported to congregational nurse office at [facility] for routine BP and HR check.  Vitals: BP 143/86; HR 76  Pt appeared well-groomed, calm, cooperative. Pt was educated about management of HTN (exercise and diet - reduce salt). Pt is taking BP medication at home. Pt had no other questions or concerns at this time.  [name] Ayaan Ringle RN BSN PCCN  [facility] [phone]-cell [phone]

## 2023-03-14 NOTE — Congregational Nurse Program (Signed)
  Dept: [phone]   Congregational Nurse Program Note  Date of Encounter: 03/14/2023  Past Medical History: Past Medical History:  Diagnosis Date   Cataract    Chronic obstructive pulmonary disease (HCC) 07/26/2020   Dyspnea on exertion 05/13/2020   Hypertension     Encounter Details:  Community Questionnaire - 03/14/23 1132       Questionnaire   Ask client: Do you give verbal consent for me to treat you today? Yes    Student Assistance [employer] Nurse    Location Patient Served  [facility]    Encounter Setting CN site    Population Status Migrant/Refugee    Insurance Medicaid;Private or VA Insurance    Insurance/Financial Assistance Referral N/A    Medication N/A    Medical Provider Yes    Screening Referrals Made N/A    Medical Referrals Made N/A    Medical Appointment Completed N/A    CNP Interventions Advocate/Support;Navigate Healthcare System;Case Management;Educate;Counsel    Screenings CN Performed Blood Pressure    ED Visit Averted Yes    Life-Saving Intervention Made N/A      Questionnaire   Housing/Utilities N/A            Patient presents for follow up and routine blood pressure check. Blood pressure elevated. Patient educated on the importance of diet and exercise to manage blood pressure.  [name] Gesselle Fitzsimons RN BSN PCCN  [facility] [phone]-cell [phone]-office

## 2023-03-28 ENCOUNTER — Ambulatory Visit: Payer: Medicaid Other

## 2023-04-03 NOTE — Congregational Nurse Program (Signed)
  Dept: [phone]   Congregational Nurse Program Note  Date of Encounter: 04/03/2023  Past Medical History: Past Medical History:  Diagnosis Date   Cataract    Chronic obstructive pulmonary disease (HCC) 07/26/2020   Dyspnea on exertion 05/13/2020   Hypertension     Encounter Details:  Community Questionnaire - 04/03/23 1114       Questionnaire   Ask client: Do you give verbal consent for me to treat you today? Yes    Student Assistance [employer] Nurse    Location [facility] Served  [facility]    Encounter Setting CN site    Population Status Migrant/Refugee    Insurance Medicaid;Private or VA Insurance    Insurance/Financial Assistance Referral N/A    Medication N/A    Medical Provider Yes    Screening Referrals Made N/A    Medical Referrals Made N/A    Medical Appointment Completed N/A    CNP Interventions Advocate/Support;Navigate Healthcare System;Case Management;Educate;Counsel    Screenings CN Performed Blood Pressure    ED Visit Averted Yes    Life-Saving Intervention Made N/A      Questionnaire   Housing/Utilities N/A            Patient came in for routine blood pressure check. She reported that she is compliant with medication. Health education was provided on diet and exercise.  [name] Jackalynn Art RN BSN PCCN  [facility] [phone]-cell [phone]-office

## 2023-05-08 NOTE — Congregational Nurse Program (Signed)
  Dept: [phone]   Congregational Nurse Program Note  Date of Encounter: 05/08/2023  Past Medical History: Past Medical History:  Diagnosis Date   Cataract    Chronic obstructive pulmonary disease (HCC) 07/26/2020   Dyspnea on exertion 05/13/2020   Hypertension     Encounter Details:  Community Questionnaire - 05/08/23 1120       Questionnaire   Ask client: Do you give verbal consent for me to treat you today? Yes    Student Assistance N/A    Location [facility] Served  [facility]    Encounter Setting CN site    Population Status Migrant/Refugee    Insurance Medicaid;Private or VA Insurance    Insurance/Financial Assistance Referral N/A    Medication N/A    Medical Provider Yes    Screening Referrals Made N/A    Medical Referrals Made N/A    Medical Appointment Completed N/A    CNP Interventions Advocate/Support;Navigate Healthcare System;Case Management;Educate;Counsel    Screenings CN Performed Blood Pressure    ED Visit Averted Yes    Life-Saving Intervention Made N/A            Patient came in for routine blood pressure check, pressure WNL, complaining back pain advised to get OTC medication, counsel provided and appointment coming up.   [name] Nnaemeka Samson RN BSN PCCN  [facility] [phone] [phone]-office

## 2023-06-12 NOTE — Congregational Nurse Program (Signed)
  Dept: [phone]   Congregational Nurse Program Note  Date of Encounter: 06/12/2023  Past Medical History: Past Medical History:  Diagnosis Date   Cataract    Chronic obstructive pulmonary disease (HCC) 07/26/2020   Dyspnea on exertion 05/13/2020   Hypertension     Encounter Details:  Community Questionnaire - 06/12/23 1231       Questionnaire   Ask client: Do you give verbal consent for me to treat you today? Yes    Student Assistance [employer]    Location [facility] Served  [facility]    Encounter Setting CN site    Population Status Migrant/Refugee    Insurance Medicaid;Private or VA Insurance    Insurance/Financial Assistance Referral N/A    Medication N/A    Medical Provider Yes    Screening Referrals Made N/A    Medical Referrals Made N/A    Medical Appointment Completed N/A    CNP Interventions Advocate/Support;Navigate Healthcare System;Case Management;Educate;Counsel    Screenings CN Performed Blood Pressure    ED Visit Averted Yes    Life-Saving Intervention Made N/A            Patient came in for BP check. BP within normal limits.   Naomie Vonnetta Akey RN BSN PCCN  [facility] [phone]-cell [phone]-office

## 2023-06-26 NOTE — Congregational Nurse Program (Signed)
  Dept: [phone]   Congregational Nurse Program Note  Date of Encounter: 06/26/2023  Past Medical History: Past Medical History:  Diagnosis Date   Cataract    Chronic obstructive pulmonary disease (HCC) 07/26/2020   Dyspnea on exertion 05/13/2020   Hypertension     Encounter Details:  Community Questionnaire - 06/26/23 1122       Questionnaire   Ask client: Do you give verbal consent for me to treat you today? Yes    Student Assistance [employer]    Location [facility] Served  [facility]    Encounter Setting CN site    Population Status Migrant/Refugee    Insurance Medicaid;Private or VA Insurance    Insurance/Financial Assistance Referral N/A    Medication N/A    Medical Provider Yes    Screening Referrals Made N/A    Medical Referrals Made N/A    Medical Appointment Completed [facility] PCP[facility]inic    CNP Interventions Advocate/Support;Navigate Healthcare System;Case Management;Educate;Counsel    Screenings CN Performed Blood Pressure    ED Visit Averted Yes    Life-Saving Intervention Made N/A           Patient came for routine BP check. She reports a fall two weeks ago and complaining of tail bone ache.Patient is [facility]. Patient requested to make an appointment and to use over the counter medication.   [name] Raja Liska RN BSN PCCN  [facility] [phone]-cell [phone]-office

## 2023-08-08 NOTE — Congregational Nurse Program (Signed)
  Dept: [phone]   Congregational Nurse Program Note  Date of Encounter: 08/08/2023  Past Medical History: Past Medical History:  Diagnosis Date   Cataract    Chronic obstructive pulmonary disease (HCC) 07/26/2020   Dyspnea on exertion 05/13/2020   Hypertension     Encounter Details:  Community Questionnaire - 08/08/23 1241       Questionnaire   Ask client: Do you give verbal consent for me to treat you today? Yes    Student Assistance [employer]    Location [facility] Served  [facility]    Encounter Setting CN site    Population Status Migrant/Refugee    Insurance Medicaid;Private or VA Insurance    Insurance/Financial Assistance Referral N/A    Medication N/A    Medical Provider Yes    Screening Referrals Made N/A    Medical Referrals Made N/A    Medical Appointment Completed [facility]    CNP Interventions Advocate/Support;Navigate Healthcare System;Case Management;Educate;Counsel    Screenings CN Performed Blood Pressure    ED Visit Averted Yes    Life-Saving Intervention Made N/A            Patient came in for BP monitoring. BP today 119/69. No issues today, She is compliant with medications.  [name] Halyn Flaugher RN BSN PCCN  [facility] [phone]-office

## 2023-09-04 NOTE — Congregational Nurse Program (Signed)
  Dept: [phone]   Congregational Nurse Program Note  Date of Encounter: 09/04/2023  Past Medical History: Past Medical History:  Diagnosis Date   Cataract    Chronic obstructive pulmonary disease (HCC) 07/26/2020   Dyspnea on exertion 05/13/2020   Hypertension     Encounter Details:  Community Questionnaire - 09/04/23 1342       Questionnaire   Ask client: Do you give verbal consent for me to treat you today? Yes    Student Assistance [employer]    Location [facility] S[facility]  [facility]    Encounter Setting CN site    Population Status Migrant/Refugee    Insurance Medicaid;Private or VA Insurance    Insurance/Financial Assistance Referral N/A    Medication N/A    Medical Provider Yes    Screening Referrals Made N/A    Medical Referrals Made N/A    Medical Appointment Completed [facility]    CNP Interventions Advocate/Support;Navigate Healthcare System;Case Management;Educate;Counsel    Screenings CN Performed Blood Pressure    ED Visit Averted Yes    Life-Saving Intervention Made N/A            Patient brought in medication for education. Education on administration of the medication with teach back. Medication included allergy medication,eye ointment and eye drops.Patient verbalized understanding.  [name] Arlind Klingerman RN BSN PCCN  [facility] [phone]-office

## 2023-09-07 ENCOUNTER — Encounter: Payer: Self-pay | Admitting: *Deleted

## 2023-09-07 LAB — GLUCOSE, POCT (MANUAL RESULT ENTRY): POC Glucose: 101 mg/dL — AB (ref 70–99)

## 2023-09-11 NOTE — Congregational Nurse Program (Signed)
  Dept: [phone]   Congregational Nurse Program Note  Date of Encounter: 09/07/2023  Past Medical History: Past Medical History:  Diagnosis Date   Cataract    Chronic obstructive pulmonary disease (HCC) 07/26/2020   Dyspnea on exertion 05/13/2020   Hypertension     Encounter Details:  Community Questionnaire - 09/11/23 0956       Questionnaire   Ask client: Do you give verbal consent for me to treat you today? Yes    Student Assistance N/A    Location Patient Served  [facility]    Encounter Setting CN site    Population Status Migrant/Refugee    Insurance Medicare    Insurance/Financial Assistance Referral N/A    Medication N/A    Medical Provider Yes    Screening Referrals Made N/A    Medical Referrals Made N/A    Medical Appointment Completed N/A    CNP Interventions Advocate/Support;Educate;Counsel    Screenings CN Performed Blood Pressure;Blood Glucose    ED Visit Averted Yes    Life-Saving Intervention Made N/A            Client came into [facility] nurse clinic.  BP 153/76 and fingerstick glucose 101.  Educated client regarding HTN and the importance of taking medication prescriptions as directed.  Client will follow up with this CN as desired.  Derk Fleming, RN, MSN, CNP [phone] Office [phone] Cell

## 2023-09-25 NOTE — Congregational Nurse Program (Signed)
  Dept: [phone]   Congregational Nurse Program Note  Date of Encounter: 09/25/2023  Past Medical History: Past Medical History:  Diagnosis Date   Cataract    Chronic obstructive pulmonary disease (HCC) 07/26/2020   Dyspnea on exertion 05/13/2020   Hypertension     Encounter Details:  Community Questionnaire - 09/25/23 1817       Questionnaire   Ask client: Do you give verbal consent for me to treat you today? Yes    Student Assistance N/A    Location [facility] Served  [facility]    Encounter Setting CN site    Population Status Migrant/Refugee    Insurance Medicare    Insurance/Financial Assistance Referral N/A    Medication N/A    Medical Provider Yes    Screening Referrals Made N/A    Medical Referrals Made N/A    Medical Appointment Completed [facility]inic    CNP Interventions Advocate/Support;Educate;Counsel;Navigate Healthcare System;Case Management    Screenings CN Performed N/A    ED Visit Averted Yes    Life-Saving Intervention Made N/A            Patient brought eye drops requesting assistance with administration regimen. Patient given instruction on when and how to administer the eye drops. Patient verbalized understanding.  Perla Bradford Sergi Gellner RN BSN PCCN  [facility] [phone] [phone]-office

## 2023-09-28 ENCOUNTER — Encounter: Payer: Self-pay | Admitting: *Deleted

## 2023-09-28 NOTE — Congregational Nurse Program (Signed)
  Dept: [phone]   Congregational Nurse Program Note  Date of Encounter: 09/28/2023  Past Medical History: Past Medical History:  Diagnosis Date   Cataract    Chronic obstructive pulmonary disease (HCC) 07/26/2020   Dyspnea on exertion 05/13/2020   Hypertension     Encounter Details:  Community Questionnaire - 09/28/23 1415       Questionnaire   Ask client: Do you give verbal consent for me to treat you today? Yes    Student Assistance N/A    Location Patient Served  [facility]    Encounter Setting CN site    Population Status Migrant/Refugee    Insurance Medicare    Insurance/Financial Assistance Referral N/A    Medication N/A    Medical Provider Yes    Screening Referrals Made N/A    Medical Referrals Made N/A    Medical Appointment Completed [facility]inic    CNP Interventions Advocate/Support;Educate;Counsel;Navigate Healthcare System;Case Management    Screenings CN Performed Blood Pressure    ED Visit Averted Yes    Life-Saving Intervention Made N/A            Client came into clinic today for food pantry and BP was checked.  BP 121/73 HR 67.  Client offers no complaints today.  Derk Fleming, RN, MSN, CNP [phone] Office [phone] Cell

## 2023-11-20 NOTE — Congregational Nurse Program (Signed)
  Dept: [phone]   Congregational Nurse Program Note  Date of Encounter: 11/20/2023  Past Medical History: Past Medical History:  Diagnosis Date   Cataract    Chronic obstructive pulmonary disease (HCC) 07/26/2020   Dyspnea on exertion 05/13/2020   Hypertension     Encounter Details:  Community Questionnaire - 11/20/23 1057       Questionnaire   Ask client: Do you give verbal consent for me to treat you today? Yes    Student Assistance Medical Student    Location Patient Served  [facility]    Encounter Setting CN site    Population Status Migrant/Refugee    Insurance Medicare    Insurance/Financial Assistance Referral N/A    Medication N/A    Medical Provider Yes    Screening Referrals Made N/A    Medical Referrals Made N/A    Medical Appointment Completed [facility]inic    CNP Interventions Advocate/Support;Educate;Counsel;Navigate Healthcare System;Case Management    Screenings CN Performed Blood Pressure    ED Visit Averted Yes    Life-Saving Intervention Made N/A         Patient is complaining of left eye pain, irritation, and swelling with increased tearing. She had surgery on June 13th. She was prescribed antibiotic eye drops and Prednisone  eye drops. She has only been using antibiotic eye drops. Advised to start using Prednisone  eye drops as well.  She would like to see the eye doctor for follow up. [facility] I was advised to go through [facility] for appointments. Contacted [facility] and they will reach out to the patient.  Alyssa Semir Brill RN BSN PCCN  [facility] [phone]-cell [phone]-office

## 2024-01-18 ENCOUNTER — Other Ambulatory Visit: Payer: Self-pay | Admitting: *Deleted

## 2024-01-30 NOTE — Congregational Nurse Program (Signed)
  Dept: [phone]   Congregational Nurse Program Note  Date of Encounter: 01/30/2024  Past Medical History: Past Medical History:  Diagnosis Date   Cataract    Chronic obstructive pulmonary disease (HCC) 07/26/2020   Dyspnea on exertion 05/13/2020   Hypertension     Encounter Details:  Community Questionnaire - 01/30/24 1350       Questionnaire   Ask client: Do you give verbal consent for me to treat you today? Yes    Student Assistance N/A    Location Patient Served  [facility]    Encounter Setting CN site    Population Status Migrant/Refugee    Insurance Medicare    Insurance/Financial Assistance Referral N/A    Medication N/A    Medical Provider Yes    Screening Referrals Made N/A    Medical Referrals Made N/A    Medical Appointment Completed [facility]inic    CNP Interventions Advocate/Support;Educate;Counsel;Navigate Healthcare System;Case Management    Screenings CN Performed Blood Pressure    ED Visit Averted Yes    Life-Saving Intervention Made N/A         Patient came in to see Congregational RN for routine BP monitoring. Blood pressure today 115/80 Hr 74  Chaden Doom RN BSN PCCN  [facility] [phone]-office

## 2024-02-15 ENCOUNTER — Encounter: Payer: Self-pay | Admitting: *Deleted

## 2024-02-15 LAB — GLUCOSE, POCT (MANUAL RESULT ENTRY): POC Glucose: 117 mg/dL — AB (ref 70–99)

## 2024-02-15 NOTE — Congregational Nurse Program (Signed)
  Dept: [phone]   Congregational Nurse Program Note  Date of Encounter: 02/15/2024  Past Medical History: Past Medical History:  Diagnosis Date   Cataract    Chronic obstructive pulmonary disease (HCC) 07/26/2020   Dyspnea on exertion 05/13/2020   Hypertension     Encounter Details:  Community Questionnaire - 02/15/24 1400      Questionnaire   Ask client: Do you give verbal consent for me to treat you today? Yes    Student Assistance N/A    Location Patient Served  [facility]    Encounter Setting CN site    Population Status Migrant/Refugee    Insurance Medicare    Insurance/Financial Assistance Referral N/A    Medication N/A    Medical Provider Yes    Screening Referrals Made N/A    Medical Referrals Made N/A    Medical Appointment Completed N/A    CNP Interventions Advocate/Support;Counsel;Educate    Screenings CN Performed Blood Pressure;Blood Glucose    ED Visit Averted Yes    Life-Saving Intervention Made N/A         Client came into [facility] for food pantry and nurse clinic.  BP 123/77 HR 71 and fingerstick glucose 117.  Results all shared with client.  Client will follow up with this CN as desired.  Lugene Ropes, RN, MSN, CNP [phone] Office [phone] Cell

## 2024-03-26 NOTE — Congregational Nurse Program (Signed)
  Dept: [phone]   Congregational Nurse Program Note  Date of Encounter: 03/26/2024  Past Medical History: Past Medical History:  Diagnosis Date   Cataract    Chronic obstructive pulmonary disease (HCC) 07/26/2020   Dyspnea on exertion 05/13/2020   Hypertension     Encounter Details:  Community Questionnaire - 03/26/24 1145       Questionnaire   Ask client: Do you give verbal consent for me to treat you today? Yes    Student Assistance [employer]    Location Patient Served  [facility]    Encounter Setting CN site    Population Status Migrant/Refugee    Insurance Medicare    Insurance/Financial Assistance Referral [employer]    Medication N/A    Medical Provider Yes    Screening Referrals Made N/A    Medical Referrals Made N/A    Medical Appointment Completed N/A    CNP Interventions Advocate/Support;Counsel;Educate;Case Management    Screenings CN Performed Blood Pressure    ED Visit Averted Yes    Life-Saving Intervention Made N/A         Patient came in for blood pressure check BP 141/84. Educated on health healthy low salt diet. She has not received money on her food stamps card, case manger at [facility] is assisting with re application of SNAP benefits.   Naomie Eudell Mcphee RN BSN PCCN  [facility] [phone]-cell [phone]-office

## 2024-05-19 ENCOUNTER — Other Ambulatory Visit: Payer: Self-pay | Admitting: Nurse Practitioner

## 2024-05-19 DIAGNOSIS — R1011 Right upper quadrant pain: Secondary | ICD-10-CM

## 2024-06-04 LAB — GLUCOSE, POCT (MANUAL RESULT ENTRY): POC Glucose: 88 mg/dL (ref 70–99)

## 2024-06-04 NOTE — Congregational Nurse Program (Signed)
" °  Dept: [phone]   Congregational Nurse Program Note  Date of Encounter: 06/04/2024  Past Medical History: Past Medical History:  Diagnosis Date   Cataract    Chronic obstructive pulmonary disease (HCC) 07/26/2020   Dyspnea on exertion 05/13/2020   Hypertension     Encounter Details:  Community Questionnaire - 06/04/24 1246       Questionnaire   Ask client: Do you give verbal consent for me to treat you today? Yes    Student Assistance N/A    Location Patient Served  [facility]    Encounter Setting CN site    Population Status Migrant farmer/Refugee/Immigrant    Insurance Medicare    Insurance/Financial Assistance Referral [employer]    Medication N/A    Medical Provider Yes    Medical Referrals Made [facility] PCP/Clinic    Medical Appointment Completed N/A    Screenings CN Performed (remember to also record results) Blood Glucose    CNP Interventions Case Management;Health Counseling;Advocate/Support    ED Visit Averted Yes         Patient came is wheezing after coming up the stairs. She has ran out of Albuterol  inhaler. I have called [facility] and medication refill done. It will be delivered to her home.  Naomie Yoceline Bazar RN BSN PCCN  [facility] [phone]-cell [phone]-office    "

## 2024-06-27 ENCOUNTER — Other Ambulatory Visit

## 2024-07-21 ENCOUNTER — Other Ambulatory Visit
